# Patient Record
Sex: Female | Born: 1970 | Race: Black or African American | Hispanic: No | Smoking: Never smoker
Health system: Southern US, Community
[De-identification: ages and names within clinical notes are randomized; demographics above are authoritative.]

## PROBLEM LIST (undated history)

## (undated) HISTORY — PX: HERNIA REPAIR: SHX51

---

## 2007-06-26 ENCOUNTER — Encounter: Payer: Self-pay | Admitting: Internal Medicine

## 2007-08-07 ENCOUNTER — Ambulatory Visit: Payer: Self-pay | Admitting: Internal Medicine

## 2007-08-07 ENCOUNTER — Encounter: Admission: RE | Admit: 2007-08-07 | Discharge: 2007-08-07 | Payer: Self-pay | Admitting: Internal Medicine

## 2007-08-07 DIAGNOSIS — B20 Human immunodeficiency virus [HIV] disease: Secondary | ICD-10-CM

## 2007-08-07 DIAGNOSIS — J45909 Unspecified asthma, uncomplicated: Secondary | ICD-10-CM | POA: Insufficient documentation

## 2007-08-07 LAB — CONVERTED CEMR LAB
ALT: 10 units/L (ref 0–35)
Basophils Absolute: 0 10*3/uL (ref 0.0–0.1)
CO2: 22 meq/L (ref 19–32)
Calcium: 8.8 mg/dL (ref 8.4–10.5)
Chlamydia, Swab/Urine, PCR: NEGATIVE
Chloride: 106 meq/L (ref 96–112)
GC Probe Amp, Urine: NEGATIVE
HIV 1 RNA Quant: 78 copies/mL — ABNORMAL HIGH (ref <50)
HIV: REACTIVE
Hemoglobin, Urine: NEGATIVE
Hepatitis B Surface Ag: NEGATIVE
Hepatitis B Surface Ag: NEGATIVE
Leukocytes, UA: NEGATIVE
Lymphocytes Relative: 29 % (ref 12–46)
Neutro Abs: 3.9 10*3/uL (ref 1.7–7.7)
Neutrophils Relative %: 63 % (ref 43–77)
Nitrite: NEGATIVE
Platelets: 317 10*3/uL (ref 150–400)
RDW: 13.5 % (ref 11.5–14.0)
Sodium: 139 meq/L (ref 135–145)
Total Bilirubin: 0.6 mg/dL (ref 0.3–1.2)
Total Protein: 7.6 g/dL (ref 6.0–8.3)
Urobilinogen, UA: 0.2 (ref 0.0–1.0)
pH: 8 (ref 5.0–8.0)

## 2007-08-29 ENCOUNTER — Ambulatory Visit: Payer: Self-pay | Admitting: Internal Medicine

## 2007-09-26 ENCOUNTER — Encounter (INDEPENDENT_AMBULATORY_CARE_PROVIDER_SITE_OTHER): Payer: Self-pay | Admitting: *Deleted

## 2007-10-22 ENCOUNTER — Encounter: Payer: Self-pay | Admitting: Internal Medicine

## 2007-11-17 ENCOUNTER — Encounter (INDEPENDENT_AMBULATORY_CARE_PROVIDER_SITE_OTHER): Payer: Self-pay | Admitting: *Deleted

## 2007-11-18 ENCOUNTER — Encounter: Payer: Self-pay | Admitting: Internal Medicine

## 2007-11-18 ENCOUNTER — Encounter (INDEPENDENT_AMBULATORY_CARE_PROVIDER_SITE_OTHER): Payer: Self-pay | Admitting: *Deleted

## 2007-12-03 ENCOUNTER — Ambulatory Visit: Payer: Self-pay | Admitting: Internal Medicine

## 2007-12-03 ENCOUNTER — Encounter: Admission: RE | Admit: 2007-12-03 | Discharge: 2007-12-03 | Payer: Self-pay | Admitting: Internal Medicine

## 2007-12-03 LAB — CONVERTED CEMR LAB
ALT: 10 units/L (ref 0–35)
AST: 16 units/L (ref 0–37)
Albumin: 3.9 g/dL (ref 3.5–5.2)
Basophils Absolute: 0 10*3/uL (ref 0.0–0.1)
CO2: 25 meq/L (ref 19–32)
Calcium: 9 mg/dL (ref 8.4–10.5)
Chloride: 106 meq/L (ref 96–112)
HIV 1 RNA Quant: 113 copies/mL — ABNORMAL HIGH (ref <50)
Hemoglobin: 11.3 g/dL — ABNORMAL LOW (ref 12.0–15.0)
Lymphocytes Relative: 29 % (ref 12–46)
Monocytes Absolute: 0.6 10*3/uL (ref 0.1–1.0)
Neutro Abs: 4.3 10*3/uL (ref 1.7–7.7)
Neutrophils Relative %: 62 % (ref 43–77)
Platelets: 294 10*3/uL (ref 150–400)
Potassium: 4 meq/L (ref 3.5–5.3)
RDW: 14.4 % (ref 11.5–15.5)
Sodium: 141 meq/L (ref 135–145)
Total Protein: 7.2 g/dL (ref 6.0–8.3)

## 2007-12-26 ENCOUNTER — Ambulatory Visit: Payer: Self-pay | Admitting: Internal Medicine

## 2008-03-09 ENCOUNTER — Telehealth (INDEPENDENT_AMBULATORY_CARE_PROVIDER_SITE_OTHER): Payer: Self-pay | Admitting: *Deleted

## 2008-06-17 ENCOUNTER — Encounter: Payer: Self-pay | Admitting: Internal Medicine

## 2008-06-25 ENCOUNTER — Encounter: Payer: Self-pay | Admitting: Internal Medicine

## 2008-06-25 ENCOUNTER — Encounter: Admission: RE | Admit: 2008-06-25 | Discharge: 2008-06-25 | Payer: Self-pay | Admitting: Internal Medicine

## 2008-06-25 ENCOUNTER — Ambulatory Visit: Payer: Self-pay | Admitting: Internal Medicine

## 2008-06-25 DIAGNOSIS — R030 Elevated blood-pressure reading, without diagnosis of hypertension: Secondary | ICD-10-CM

## 2008-06-25 LAB — CONVERTED CEMR LAB
ALT: 11 units/L (ref 0–35)
AST: 16 units/L (ref 0–37)
Alkaline Phosphatase: 40 units/L (ref 39–117)
BUN: 13 mg/dL (ref 6–23)
Basophils Absolute: 0 10*3/uL (ref 0.0–0.1)
Basophils Relative: 0 % (ref 0–1)
Calcium: 8.8 mg/dL (ref 8.4–10.5)
Chloride: 105 meq/L (ref 96–112)
Creatinine, Ser: 0.72 mg/dL (ref 0.40–1.20)
Eosinophils Absolute: 0.1 10*3/uL (ref 0.0–0.7)
MCHC: 31.2 g/dL (ref 30.0–36.0)
MCV: 89 fL (ref 78.0–100.0)
Neutro Abs: 4.3 10*3/uL (ref 1.7–7.7)
Neutrophils Relative %: 65 % (ref 43–77)
Pap Smear: NORMAL
Potassium: 3.9 meq/L (ref 3.5–5.3)
RDW: 14.5 % (ref 11.5–15.5)

## 2008-07-07 ENCOUNTER — Encounter: Payer: Self-pay | Admitting: Internal Medicine

## 2008-07-14 ENCOUNTER — Ambulatory Visit: Payer: Self-pay | Admitting: Internal Medicine

## 2008-12-07 ENCOUNTER — Encounter: Payer: Self-pay | Admitting: Internal Medicine

## 2009-01-20 ENCOUNTER — Ambulatory Visit: Payer: Self-pay | Admitting: Internal Medicine

## 2009-01-20 LAB — CONVERTED CEMR LAB
AST: 16 units/L (ref 0–37)
Albumin: 4.3 g/dL (ref 3.5–5.2)
BUN: 9 mg/dL (ref 6–23)
Band Neutrophils: 0 % (ref 0–10)
CO2: 23 meq/L (ref 19–32)
Calcium: 8.9 mg/dL (ref 8.4–10.5)
Chloride: 106 meq/L (ref 96–112)
Creatinine, Ser: 0.64 mg/dL (ref 0.40–1.20)
Eosinophils Absolute: 0.1 10*3/uL (ref 0.0–0.7)
Eosinophils Relative: 2 % (ref 0–5)
Glucose, Bld: 82 mg/dL (ref 70–99)
HCT: 36.3 % (ref 36.0–46.0)
HIV 1 RNA Quant: 632 copies/mL — ABNORMAL HIGH (ref <48)
HIV-1 RNA Quant, Log: 2.8 — ABNORMAL HIGH (ref <1.68)
Lymphocytes Relative: 28 % (ref 12–46)
Lymphs Abs: 2 10*3/uL (ref 0.7–4.0)
MCHC: 32 g/dL (ref 30.0–36.0)
MCV: 89.6 fL (ref 78.0–100.0)
Monocytes Absolute: 0.4 10*3/uL (ref 0.1–1.0)
Monocytes Relative: 6 % (ref 3–12)
Potassium: 3.7 meq/L (ref 3.5–5.3)
RBC: 4.05 M/uL (ref 3.87–5.11)
WBC: 6.9 10*3/uL (ref 4.0–10.5)

## 2009-02-08 ENCOUNTER — Ambulatory Visit: Payer: Self-pay | Admitting: Internal Medicine

## 2009-02-08 DIAGNOSIS — E663 Overweight: Secondary | ICD-10-CM

## 2009-02-08 DIAGNOSIS — Z6841 Body Mass Index (BMI) 40.0 and over, adult: Secondary | ICD-10-CM | POA: Insufficient documentation

## 2009-02-10 ENCOUNTER — Encounter (INDEPENDENT_AMBULATORY_CARE_PROVIDER_SITE_OTHER): Payer: Self-pay | Admitting: *Deleted

## 2009-06-24 ENCOUNTER — Ambulatory Visit: Payer: Self-pay | Admitting: Internal Medicine

## 2009-06-24 ENCOUNTER — Encounter: Payer: Self-pay | Admitting: Internal Medicine

## 2009-06-24 LAB — CONVERTED CEMR LAB
AST: 18 units/L (ref 0–37)
Alkaline Phosphatase: 45 units/L (ref 39–117)
Basophils Absolute: 0 10*3/uL (ref 0.0–0.1)
Basophils Relative: 0 % (ref 0–1)
Eosinophils Absolute: 0.1 10*3/uL (ref 0.0–0.7)
Glucose, Bld: 89 mg/dL (ref 70–99)
HIV 1 RNA Quant: 1210 copies/mL — ABNORMAL HIGH (ref <48)
MCHC: 30.7 g/dL (ref 30.0–36.0)
MCV: 90.9 fL (ref >=78.0)
Monocytes Absolute: 0.4 10*3/uL (ref 0.1–1.0)
Monocytes Relative: 7 % (ref 3–12)
Neutrophils Relative %: 67 % (ref 43–77)
Potassium: 3.6 meq/L (ref 3.5–5.3)
RBC: 3.97 M/uL (ref 3.87–5.11)
RDW: 14.2 % (ref 11.5–15.5)
Sodium: 140 meq/L (ref 135–145)
Total Bilirubin: 0.5 mg/dL (ref 0.3–1.2)
Total CHOL/HDL Ratio: 3.3
Total Protein: 7.1 g/dL (ref 6.0–8.3)
VLDL: 23 mg/dL (ref 0–40)

## 2009-07-01 ENCOUNTER — Encounter: Payer: Self-pay | Admitting: Internal Medicine

## 2010-01-09 ENCOUNTER — Ambulatory Visit: Payer: Self-pay | Admitting: Internal Medicine

## 2010-01-09 LAB — CONVERTED CEMR LAB
ALT: 12 units/L (ref 0–35)
AST: 17 units/L (ref 0–37)
Albumin: 4.2 g/dL (ref 3.5–5.2)
BUN: 14 mg/dL (ref 6–23)
CO2: 23 meq/L (ref 19–32)
Calcium: 8.8 mg/dL (ref 8.4–10.5)
Chloride: 104 meq/L (ref 96–112)
Creatinine, Ser: 0.7 mg/dL (ref 0.40–1.20)
Eosinophils Absolute: 0.1 10*3/uL (ref 0.0–0.7)
Eosinophils Relative: 1 % (ref 0–5)
HCT: 35.6 % — ABNORMAL LOW (ref 36.0–46.0)
HIV 1 RNA Quant: 834 copies/mL — ABNORMAL HIGH (ref <48)
Hemoglobin: 11.3 g/dL — ABNORMAL LOW (ref 12.0–15.0)
Lymphocytes Relative: 28 % (ref 12–46)
Lymphs Abs: 2.5 10*3/uL (ref 0.7–4.0)
MCV: 89 fL (ref >=78.0)
Monocytes Relative: 6 % (ref 3–12)
Platelets: 364 10*3/uL (ref 150–400)
Potassium: 4 meq/L (ref 3.5–5.3)
RBC: 4 M/uL (ref 3.87–5.11)
WBC: 9 10*3/uL (ref 4.0–10.5)

## 2010-03-17 ENCOUNTER — Ambulatory Visit: Payer: Self-pay | Admitting: Internal Medicine

## 2010-05-31 ENCOUNTER — Encounter (INDEPENDENT_AMBULATORY_CARE_PROVIDER_SITE_OTHER): Payer: Self-pay | Admitting: *Deleted

## 2010-08-16 ENCOUNTER — Encounter: Payer: Self-pay | Admitting: Internal Medicine

## 2010-08-21 ENCOUNTER — Ambulatory Visit: Payer: Self-pay | Admitting: Internal Medicine

## 2010-08-21 LAB — CONVERTED CEMR LAB
ALT: 11 units/L (ref 0–35)
AST: 17 units/L (ref 0–37)
Albumin: 4.3 g/dL (ref 3.5–5.2)
Alkaline Phosphatase: 43 units/L (ref 39–117)
Basophils Absolute: 0 10*3/uL (ref 0.0–0.1)
Basophils Relative: 0 % (ref 0–1)
Calcium: 9.2 mg/dL (ref 8.4–10.5)
Chloride: 108 meq/L (ref 96–112)
Eosinophils Absolute: 0.1 10*3/uL (ref 0.0–0.7)
MCHC: 30.9 g/dL (ref 30.0–36.0)
MCV: 90.7 fL (ref 78.0–100.0)
Neutro Abs: 4.2 10*3/uL (ref 1.7–7.7)
Neutrophils Relative %: 59 % (ref 43–77)
Platelets: 315 10*3/uL (ref 150–400)
Potassium: 4.6 meq/L (ref 3.5–5.3)
RBC: 4.21 M/uL (ref 3.87–5.11)
Sodium: 144 meq/L (ref 135–145)
Total Protein: 7.9 g/dL (ref 6.0–8.3)

## 2010-09-07 ENCOUNTER — Ambulatory Visit: Payer: Self-pay | Admitting: Internal Medicine

## 2010-12-19 NOTE — Assessment & Plan Note (Signed)
Summary: F/U [MKJ]   CC:  follow-up visit, lab results, and due for pap.  History of Present Illness: Pt here for lab results. She has been feeling well except some allergy symptoms.  Preventive Screening-Counseling & Management  Alcohol-Tobacco     Alcohol drinks/day: 0     Smoking Status: never     Passive Smoke Exposure: no  Caffeine-Diet-Exercise     Caffeine use/day: 3     Does Patient Exercise: no     Type of exercise: walking     Times/week: <3  Hep-HIV-STD-Contraception     HIV Risk: risk noted  Safety-Violence-Falls     Seat Belt Use: yes      Sexual History:  currently monogamous.        Drug Use:  never.    Comments: pt. declined condoms   Updated Prior Medication List: PROVENTIL HFA 108 (90 BASE) MCG/ACT AERS (ALBUTEROL SULFATE) one puff every 6 hours as needed ATENOLOL 50 MG TABS (ATENOLOL) Take 1 tablet by mouth once a day  Current Allergies (reviewed today): No known allergies  Past History:  Past Medical History: Last updated: 08/29/2007 Asthma  Review of Systems  The patient denies anorexia, fever, and weight loss.    Vital Signs:  Patient profile:   40 year old female Menstrual status:  regular Height:      62 inches (157.48 cm) Weight:      276.0 pounds (125.45 kg) BMI:     50.66 Temp:     98.8 degrees F (37.11 degrees C) oral Pulse rate:   81 / minute BP sitting:   138 / 88  (right arm)  Vitals Entered By: Wendall Mola CMA Duncan Dull) (September 07, 2010 2:44 PM) CC: follow-up visit, lab results, due for pap Is Patient Diabetic? No Pain Assessment Patient in pain? no      Nutritional Status BMI of > 30 = obese Nutritional Status Detail appetite "good"  Have you ever been in a relationship where you felt threatened, hurt or afraid?No   Does patient need assistance? Functional Status Self care Ambulation Normal   Physical Exam  General:  alert, well-developed, well-nourished, and well-hydrated.   Head:   normocephalic and atraumatic.   Mouth:  pharynx pink and moist.   Lungs:  normal breath sounds and no wheezes.     Impression & Recommendations:  Problem # 1:  HIV INFECTION (ICD-042)  pt currently asymptomatic and not on therapy. Wil lreturn in 6 months for repeat labs.  Influenza vaccine given. Schedule for PAP. Diagnostics Reviewed:  HIV: HIV positive - not AIDS (02/08/2009)   HIV-Western blot: Positive (08/07/2007)   CD4: 750 (08/22/2010)   WBC: 7.1 (08/21/2010)   PMN (bands): 0 (01/20/2009)   Hgb: 11.8 (08/21/2010)   HCT: 38.2 (08/21/2010)   Platelets: 315 (08/21/2010) HIV-1 RNA: 891 (08/21/2010)   HBSAg: NEG (08/07/2007)  Orders: Est. Patient Level III (99213)Future Orders: T-CD4SP ([facility]) (CD4SP) ... 03/06/2011 T-HIV Viral Load [id number]) ... 03/06/2011 T-Comprehensive Metabolic Panel [id number]) ... 03/06/2011 T-CBC w/Diff ([id number]) ... 03/06/2011 T-RPR (Syphilis) [phone]) ... 03/06/2011  Problem # 2:  ASTHMA NOS W/O STATUS ASTHMATICUS (ICD-493.90) refill inhaler. Her updated medication list for this problem includes:    Proventil Hfa 108 (90 Base) Mcg/act Aers (Albuterol sulfate) ..... One puff every 6 hours as needed  Patient Instructions: 1)  Please schedule a follow-up appointment in 6 months, 2 weeks after labs. 2)  Schedule for a PAP in PAP clinic  Prescriptions: PROVENTIL HFA 108 (  90 BASE) MCG/ACT AERS (ALBUTEROL SULFATE) one puff every 6 hours as needed  #24 Gram x 5   Entered and Authorized by:   Yisroel Ramming MD   Signed by:   Yisroel Ramming MD on 09/07/2010   Method used:   Print then Give to Patient   RxID:   [id number]

## 2010-12-19 NOTE — Assessment & Plan Note (Signed)
Summary: f/u [mkj]   CC:  follow-up visit and lab results.  History of Present Illness: Pt here for f/u.  She has been feeling well. She is concerned about the health of her husband who is not in care for his HIV.  She will try to encourage him to call foa an appt.  Preventive Screening-Counseling & Management  Alcohol-Tobacco     Alcohol drinks/day: 0     Smoking Status: never     Passive Smoke Exposure: no  Caffeine-Diet-Exercise     Caffeine use/day: 3     Does Patient Exercise: yes     Type of exercise: walking     Times/week: <3  Hep-HIV-STD-Contraception     HIV Risk: no risk noted  Safety-Violence-Falls     Seat Belt Use: yes      Sexual History:  currently monogamous.        Drug Use:  never.     Updated Prior Medication List: PROVENTIL HFA 108 (90 BASE) MCG/ACT AERS (ALBUTEROL SULFATE) one puff every 6 hours as needed ATENOLOL 50 MG TABS (ATENOLOL) Take 1 tablet by mouth once a day  Current Allergies (reviewed today): No known allergies  Review of Systems  The patient denies anorexia, fever, and weight loss.    Vital Signs:  Patient profile:   40 year old female Menstrual status:  regular Height:      62 inches (157.48 cm) Weight:      276.4 pounds (125.64 kg) BMI:     50.74 Temp:     98.8 degrees F (37.11 degrees C) oral Pulse rate:   92 / minute BP sitting:   127 / 82  (left arm)  Vitals Entered By: Wendall Mola CMA Duncan Dull) (March 17, 2010 3:37 PM) CC: follow-up visit, lab results Is Patient Diabetic? No Pain Assessment Patient in pain? no      Nutritional Status BMI of > 30 = obese Nutritional Status Detail appetite "good"  Does patient need assistance? Functional Status Self care Ambulation Normal   Physical Exam  General:  alert, well-hydrated, and overweight-appearing.   Head:  normocephalic and atraumatic.   Mouth:  pharynx pink and moist.   Lungs:  normal breath sounds.      Impression & Recommendations:  Problem  # 1:  HIV INFECTION (ICD-042) Pt currently asymptomatic and not on therapy. She will return in 6 months for repeat labs. Diagnostics Reviewed:  HIV: HIV positive - not AIDS (02/08/2009)   HIV-Western blot: Positive (08/07/2007)   CD4: 900 (01/10/2010)   WBC: 9.0 (01/09/2010)   PMN (bands): 0 (01/20/2009)   Hgb: 11.3 (01/09/2010)   HCT: 35.6 (01/09/2010)   Platelets: 364 (01/09/2010) HIV-1 RNA: 834 (01/09/2010)   HBSAg: NEG (08/07/2007)  Orders: Est. Patient Level III (99213)Future Orders: T-CD4SP ([facility]) (CD4SP) ... 09/13/2010 T-HIV Viral Load [id number]) ... 09/13/2010 T-Comprehensive Metabolic Panel [id number]) ... 09/13/2010 T-CBC w/Diff ([id number]) ... 09/13/2010  Patient Instructions: 1)  Please schedule a follow-up appointment in 6 months, 2 weeks after labs.

## 2010-12-19 NOTE — Miscellaneous (Signed)
Summary: Lab orders  Clinical Lists Changes  Orders: Added new Test order of T-CBC w/Diff [phone] [phone]) - Signed Added new Test order of T-CD4SP [facility]) (CD4SP) - Signed Added new Test order of T-Comprehensive Metabolic Panel [id number]) - Signed Added new Test order of T-HIV Viral Load [id number]) - Signed     Process Orders Check Orders Results:     [facility]: ABN not required for this insurance Order queued for requisitioning for [facility]: January 09, 2010 4:25 PM  Tests Sent for requisitioning (January 09, 2010 4:25 PM):     01/09/2010: [facility] -- T-CBC w/Diff [[id number]] (signed)     01/09/2010: [facility] -- T-Comprehensive Metabolic Panel [[id number]] (signed)     01/09/2010: [facility] -- T-HIV Viral Load [phone] (signed)

## 2010-12-19 NOTE — Miscellaneous (Signed)
Summary: Orders Update - labs  Clinical Lists Changes  Problems: Added new problem of ENCOUNTER FOR LONG-TERM USE OF OTHER MEDICATIONS (ICD-V58.69) Orders: Added new Test order of T-RPR (Syphilis) [id number]) - Signed Added new Test order of T-Lipid Profile [id number]) - Signed     Process Orders Check Orders Results:     [facility]: ABN not required for this insurance Order queued for requisitioning for [facility]: August 16, 2010 10:57 AM  Tests Sent for requisitioning (August 16, 2010 10:57 AM):     08/21/2010: [facility] -- T-RPR (Syphilis) [phone] (signed)     08/21/2010: [facility] -- T-Lipid Profile [phone] (signed)

## 2010-12-19 NOTE — Miscellaneous (Signed)
Summary: clinical update/ryan white  Clinical Lists Changes  Observations: Added new observation of AIDSDAP: Yes 2011 (05/31/2010 9:50) Added new observation of PCTFPL: 77.87  (05/31/2010 9:50) Added new observation of HOUSEINCOME: 16109  (05/31/2010 9:50) Added new observation of #CHILD<18 IN: Yes  (05/31/2010 9:50) Added new observation of FAMILYSIZE: 6  (05/31/2010 9:50) Added new observation of FINASSESSDT: 05/29/2010  (05/31/2010 9:50) Added new observation of MARITAL STAT: Widowed  (05/31/2010 9:50)

## 2011-01-12 ENCOUNTER — Ambulatory Visit: Payer: Self-pay

## 2011-01-26 ENCOUNTER — Ambulatory Visit: Payer: Self-pay

## 2011-02-02 ENCOUNTER — Ambulatory Visit (INDEPENDENT_AMBULATORY_CARE_PROVIDER_SITE_OTHER): Payer: Self-pay

## 2011-02-02 ENCOUNTER — Other Ambulatory Visit: Payer: Self-pay | Admitting: Internal Medicine

## 2011-02-02 ENCOUNTER — Encounter: Payer: Self-pay | Admitting: Internal Medicine

## 2011-02-02 DIAGNOSIS — Z124 Encounter for screening for malignant neoplasm of cervix: Secondary | ICD-10-CM

## 2011-02-07 LAB — T-HELPER CELL (CD4) - (RCID CLINIC ONLY): CD4 T Cell Abs: 900 uL (ref 400–2700)

## 2011-02-09 ENCOUNTER — Encounter: Payer: Self-pay | Admitting: *Deleted

## 2011-02-15 NOTE — Letter (Signed)
Summary: Results Follow-up Letter  [facility]  [address]   [place], [place] [address]   Phone: [phone]  Fax: [phone]          February 12, 2011  [address] [place], [place]  [address]  [place]  Dear Ms. Khatoon,   The following are the results of your recent test(s):  Test     Result     Pap Smear    Normal_XXX___  Not Normal_____  Comments:  Everything was normal.  I will see you in one year for your next PAP smear.  Thank you for coming to the Center for your care.  Sincerely,    Jennet Maduro [facility]

## 2011-02-15 NOTE — Assessment & Plan Note (Signed)
Summary: pap smear   Vitals Entered By: Jennet Maduro RN (February 09, 2011 3:24 PM) CC: PAP smear visit.  Pt. given condoms.  Pt. given educational materials re:  HIV and women, BSE, nutrition, diet, exercise, mammagrams, and self-esteem.   CC:  PAP smear visit.  Pt. given condoms.  Pt. given educational materials re:  HIV and women, BSE, nutrition, diet, exercise, mammagrams, and and self-esteem..  Allergies: No Known Drug Allergies   Other Orders: T-PAP [facility]) [phone]) T-Lipid Profile [id number])  Patient Instructions: 1)  Please schedule MD appointment for pt.  2)  Please schedule a follow-up appointment in 1 month. 3)  Be sure to return for lab work one (1) week before your next appointment as scheduled.   Orders Added: 1)  T-PAP [facility]) [[id number]] 2)  T-Lipid Profile [id number]    Process Orders Check Orders Results:     [facility]: ABN not required for this insurance Tests Sent for requisitioning (February 09, 2011 3:24 PM):     02/02/2011: [facility] -- T-Lipid Profile [phone] (signed)             Prevention For Positives: 02/02/2011   Safe sex practices discussed with patient. Condoms offered.

## 2011-02-24 LAB — T-HELPER CELL (CD4) - (RCID CLINIC ONLY): CD4 % Helper T Cell: 32 % — ABNORMAL LOW (ref 33–55)

## 2011-02-26 ENCOUNTER — Other Ambulatory Visit: Payer: Self-pay

## 2011-03-02 ENCOUNTER — Other Ambulatory Visit (INDEPENDENT_AMBULATORY_CARE_PROVIDER_SITE_OTHER): Payer: BC Managed Care – PPO

## 2011-03-02 ENCOUNTER — Other Ambulatory Visit: Payer: Self-pay | Admitting: Infectious Diseases

## 2011-03-02 DIAGNOSIS — B20 Human immunodeficiency virus [HIV] disease: Secondary | ICD-10-CM

## 2011-03-02 LAB — CBC WITH DIFFERENTIAL/PLATELET
Basophils Absolute: 0 10*3/uL (ref 0.0–0.1)
Basophils Relative: 0 % (ref 0–1)
Eosinophils Relative: 2 % (ref 0–5)
HCT: 37.6 % (ref 36.0–46.0)
Hemoglobin: 12 g/dL (ref 12.0–15.0)
MCH: 29.3 pg (ref 26.0–34.0)
MCHC: 31.9 g/dL (ref 30.0–36.0)
MCV: 91.7 fL (ref 78.0–100.0)
Monocytes Absolute: 0.4 10*3/uL (ref 0.1–1.0)
Monocytes Relative: 6 % (ref 3–12)
Neutro Abs: 4.6 10*3/uL (ref 1.7–7.7)
RDW: 14.1 % (ref 11.5–15.5)

## 2011-03-02 LAB — LIPID PANEL
Cholesterol: 162 mg/dL (ref 0–200)
LDL Cholesterol: 99 mg/dL (ref 0–99)
Total CHOL/HDL Ratio: 3.6 Ratio
VLDL: 18 mg/dL (ref 0–40)

## 2011-03-02 LAB — T-HELPER CELL (CD4) - (RCID CLINIC ONLY)
CD4 % Helper T Cell: 33 % (ref 33–55)
CD4 T Cell Abs: 580 uL (ref 400–2700)

## 2011-03-03 LAB — COMPLETE METABOLIC PANEL WITH GFR
ALT: 14 U/L (ref 0–35)
AST: 22 U/L (ref 0–37)
Albumin: 4.4 g/dL (ref 3.5–5.2)
Alkaline Phosphatase: 46 U/L (ref 39–117)
Potassium: 4.6 mEq/L (ref 3.5–5.3)
Sodium: 141 mEq/L (ref 135–145)
Total Bilirubin: 0.3 mg/dL (ref 0.3–1.2)
Total Protein: 7.6 g/dL (ref 6.0–8.3)

## 2011-03-03 LAB — HIV-1 RNA QUANT-NO REFLEX-BLD: HIV-1 RNA Quant, Log: 2.96 {Log} — ABNORMAL HIGH (ref <1.30)

## 2011-03-12 ENCOUNTER — Ambulatory Visit: Payer: Self-pay | Admitting: Internal Medicine

## 2011-04-03 ENCOUNTER — Encounter: Payer: Self-pay | Admitting: Infectious Disease

## 2011-04-03 ENCOUNTER — Ambulatory Visit (INDEPENDENT_AMBULATORY_CARE_PROVIDER_SITE_OTHER): Payer: Self-pay | Admitting: Infectious Disease

## 2011-04-03 VITALS — BP 121/89 | HR 80 | Temp 98.7°F | Ht 64.0 in | Wt 256.0 lb

## 2011-04-03 DIAGNOSIS — J45909 Unspecified asthma, uncomplicated: Secondary | ICD-10-CM

## 2011-04-03 DIAGNOSIS — E663 Overweight: Secondary | ICD-10-CM

## 2011-04-03 DIAGNOSIS — B20 Human immunodeficiency virus [HIV] disease: Secondary | ICD-10-CM

## 2011-04-03 DIAGNOSIS — R030 Elevated blood-pressure reading, without diagnosis of hypertension: Secondary | ICD-10-CM

## 2011-04-03 NOTE — Assessment & Plan Note (Signed)
She has been successfully losing weight through diet and exercise.

## 2011-04-03 NOTE — Assessment & Plan Note (Signed)
She is normotensive without using her atenolol.

## 2011-04-03 NOTE — Assessment & Plan Note (Signed)
His relates more to seasonal allergies and she infrequently uses albuterol inhaler.

## 2011-04-03 NOTE — Progress Notes (Signed)
  Subjective:    Patient ID: Regina Mcdonald, female    DOB: 1971-03-30, 40 y.o.   MRN: [id number]  HPI 14 -year-old [place] American female with HIV who appears to be a long-term nonprogressor. She has persistent low-level viremia which ranges as low as the 70s approximately 4 years ago to up to 1200 Generally her viral loads have been between 500 800. Her CD4 count similarly have been healthy ranging as high as 900 as low as 500. We spent greater than 45 minutes today including greater than 50% of time spent in counseling the patient and according care including extensive discussion of the indications for antiretroviral therapy various antiretroviral regimens. I also introduced her to the strategic timing for antiretroviral therapy or start trial. Deirdre Evener met with the pt from [facility] to consider enrolling the patient. With regards to her contraception the patient is using intrauterine device at this point in time. He claims is not 6 active at this point in time. She has occasional flares of allergic rhinitis and cough for which she's taken albuterol. She's currently not taking medications for prior elevated blood pressure. Review of Systems  Constitutional: Negative for chills, diaphoresis, activity change and appetite change.  HENT: Negative for facial swelling and neck stiffness.   Eyes: Negative for photophobia and visual disturbance.  Respiratory: Negative for apnea, cough, choking, chest tightness, shortness of breath, wheezing and stridor.   Cardiovascular: Negative for chest pain, palpitations and leg swelling.  Gastrointestinal: Negative for abdominal pain, constipation, blood in stool, abdominal distention and anal bleeding.  Genitourinary: Negative for dysuria and difficulty urinating.  Musculoskeletal: Negative for myalgias, back pain, joint swelling, arthralgias and gait problem.  Skin: Negative for color change and pallor.  Neurological: Negative for dizziness, tremors, facial  asymmetry, weakness and headaches.  Hematological: Negative for adenopathy. Does not bruise/bleed easily.  Psychiatric/Behavioral: Negative for behavioral problems, confusion and agitation.       Objective:   Physical Exam  Constitutional: She is oriented to person, place, and time. She appears well-developed and well-nourished. No distress.  HENT:  Head: Normocephalic and atraumatic.  Mouth/Throat: No oropharyngeal exudate.  Eyes: EOM are normal. Pupils are equal, round, and reactive to light. No scleral icterus.  Neck: No JVD present.  Cardiovascular: Normal rate and regular rhythm.  Exam reveals no gallop and no friction rub.   No murmur heard. Pulmonary/Chest: Effort normal and breath sounds normal. No respiratory distress. She has no wheezes. She has no rales. She exhibits no tenderness.  Abdominal: She exhibits no distension and no mass. There is no tenderness. There is no rebound and no guarding.  Musculoskeletal: She exhibits no edema.  Lymphadenopathy:    She has no cervical adenopathy.  Neurological: She is alert and oriented to person, place, and time.  Skin: Skin is warm and dry. No rash noted. She is not diaphoretic. No erythema. No pallor.          Assessment & Plan:

## 2011-04-03 NOTE — Assessment & Plan Note (Signed)
Apparent long-term nonprogressor. Nonetheless she does have detectable viremia and I expect her CD4 count will gradually declined. I feel she would be an ideal candidate for the start trial.

## 2011-04-10 ENCOUNTER — Ambulatory Visit (INDEPENDENT_AMBULATORY_CARE_PROVIDER_SITE_OTHER): Payer: Self-pay | Admitting: *Deleted

## 2011-04-10 ENCOUNTER — Encounter: Payer: Self-pay | Admitting: *Deleted

## 2011-04-10 VITALS — BP 130/74 | HR 76 | Temp 98.7°F | Resp 18 | Ht 63.0 in | Wt 260.2 lb

## 2011-04-10 DIAGNOSIS — B20 Human immunodeficiency virus [HIV] disease: Secondary | ICD-10-CM

## 2011-04-10 LAB — BASIC METABOLIC PANEL
BUN: 10 mg/dL (ref 6–23)
CO2: 26 mEq/L (ref 19–32)
Chloride: 104 mEq/L (ref 96–112)
Creat: 0.62 mg/dL (ref 0.40–1.20)
Potassium: 3.9 mEq/L (ref 3.5–5.3)

## 2011-04-10 LAB — HEPATIC FUNCTION PANEL
Albumin: 4.2 g/dL (ref 3.5–5.2)
Alkaline Phosphatase: 48 U/L (ref 39–117)
Total Bilirubin: 0.4 mg/dL (ref 0.3–1.2)

## 2011-04-10 LAB — POCT URINALYSIS DIPSTICK
Glucose, UA: NEGATIVE
Nitrite, UA: NEGATIVE
Protein, UA: NEGATIVE
Urobilinogen, UA: 0.2
pH, UA: 6.5

## 2011-04-10 LAB — LIPID PANEL
Cholesterol: 133 mg/dL (ref 0–200)
Triglycerides: 86 mg/dL (ref <150)
VLDL: 17 mg/dL (ref 0–40)

## 2011-04-10 NOTE — Progress Notes (Signed)
Patient was here today to screen for the [facility] study. She had read the consent prior to the visit and had already discussed the study with Dr. Daiva Eves. Informed consent was obtained and all questions answered. She understands that she may get randomized to either deferred treatment or start treatment right away. She denies any current problems or complaints. She takes a multivitamin everyday and occasionally uses tylenol for discomfort. She acquired HIV through her husband some years ago. She will return in 2 weeks for the second screening visit.

## 2011-04-11 LAB — HEPATITIS B SURFACE ANTIGEN: Hepatitis B Surface Ag: NEGATIVE

## 2011-04-11 LAB — HEPATITIS C ANTIBODY: HCV Ab: NEGATIVE

## 2011-04-12 LAB — HEPATITIS B CORE ANTIBODY, TOTAL: Hep B Core Total Ab: POSITIVE — AB

## 2011-04-20 ENCOUNTER — Other Ambulatory Visit: Payer: Self-pay | Admitting: Family Medicine

## 2011-04-20 ENCOUNTER — Ambulatory Visit
Admission: RE | Admit: 2011-04-20 | Discharge: 2011-04-20 | Disposition: A | Discharge status: Home / Self Care | Payer: BC Managed Care – PPO | Source: Ambulatory Visit | Attending: Family Medicine | Admitting: Family Medicine

## 2011-04-20 DIAGNOSIS — R112 Nausea with vomiting, unspecified: Secondary | ICD-10-CM

## 2011-04-20 DIAGNOSIS — K439 Ventral hernia without obstruction or gangrene: Secondary | ICD-10-CM

## 2011-04-20 MED ORDER — IOHEXOL 300 MG/ML  SOLN
100.0000 mL | Freq: Once | INTRAMUSCULAR | Status: AC | PRN
  Administered 2011-04-20: 100 mL via INTRAVENOUS

## 2011-04-24 ENCOUNTER — Ambulatory Visit (INDEPENDENT_AMBULATORY_CARE_PROVIDER_SITE_OTHER): Payer: BC Managed Care – PPO | Admitting: *Deleted

## 2011-04-24 VITALS — BP 139/87 | HR 84 | Temp 98.7°F | Resp 16 | Wt 260.5 lb

## 2011-04-24 DIAGNOSIS — B20 Human immunodeficiency virus [HIV] disease: Secondary | ICD-10-CM

## 2011-04-24 LAB — POCT URINE PREGNANCY: Preg Test, Ur: NEGATIVE

## 2011-04-24 NOTE — Progress Notes (Signed)
04/24/2011 @ 0800: Pt here for last screening visit for START study. Pt denies any new findings. Vital signs are stable. EKG was performed as well as urine pregnancy test and CD4. Pt informed that we will call her with next appointment if criteria for study is met and she still wants to enter into [facility] study. -- Tacey Heap RN

## 2011-05-02 ENCOUNTER — Encounter: Payer: Self-pay | Admitting: Internal Medicine

## 2011-05-02 ENCOUNTER — Encounter: Payer: Self-pay | Admitting: *Deleted

## 2011-05-02 LAB — CD4/CD8 (T-HELPER/T-SUPPRESSOR CELL)
CD8 % Suppressor T Cell: 36
CD8 % Suppressor T Cell: 38.5
CD8: 576

## 2011-05-02 NOTE — Progress Notes (Signed)
Patient was randomized to meds for the START study yesterday. She will come in and see Dr. Daiva Eves next week on Monday at 8:45am to discuss medication options.

## 2011-05-03 ENCOUNTER — Encounter (INDEPENDENT_AMBULATORY_CARE_PROVIDER_SITE_OTHER): Payer: Self-pay | Admitting: General Surgery

## 2011-05-07 ENCOUNTER — Ambulatory Visit (INDEPENDENT_AMBULATORY_CARE_PROVIDER_SITE_OTHER): Payer: BC Managed Care – PPO | Admitting: Infectious Disease

## 2011-05-07 DIAGNOSIS — B20 Human immunodeficiency virus [HIV] disease: Secondary | ICD-10-CM

## 2011-05-07 DIAGNOSIS — R030 Elevated blood-pressure reading, without diagnosis of hypertension: Secondary | ICD-10-CM

## 2011-05-07 DIAGNOSIS — E663 Overweight: Secondary | ICD-10-CM

## 2011-05-07 MED ORDER — EFAVIRENZ-EMTRICITAB-TENOFOVIR 600-200-300 MG PO TABS: 1.0000 | ORAL_TABLET | Freq: Every day | ORAL | Status: DC

## 2011-05-07 NOTE — Progress Notes (Signed)
  Subjective:    Patient ID: Regina Mcdonald, female    DOB: 21-Oct-1971, 40 y.o.   MRN: [id number]  HPI 8 -year-old [place] American female with HIV who appears to be a long-term nonprogressor. She has persistent low-level viremia which ranges as low as the 70s approximately 4 years ago to up to 1200 Generally her viral loads have been between 500 800. Her CD4 count similarly have been healthy ranging as high as 900 as low as 500.Patient is using intrauterine device at this point in time.  She enrolled in START trial and was randomized to begin therapy. We discussed treatment options and she desires to start Atripla a prescription of which I wrote today    Review of Systems  Constitutional: Negative for chills, diaphoresis, activity change and appetite change.  HENT: Negative for facial swelling and neck pain.   Eyes: Negative for photophobia and visual disturbance.  Respiratory: Negative for apnea, choking and chest tightness.   Cardiovascular: Negative for chest pain, palpitations and leg swelling.  Genitourinary: Negative for dysuria and difficulty urinating.  Musculoskeletal: Negative for back pain, joint swelling, arthralgias and gait problem.  Neurological: Negative for light-headedness and headaches.  Hematological: Negative for adenopathy. Does not bruise/bleed easily.  Psychiatric/Behavioral: Negative for behavioral problems and agitation.       Objective:   Physical Exam  Constitutional: She is oriented to person, place, and time. She appears well-developed and well-nourished. No distress.  HENT:  Head: Normocephalic and atraumatic.  Nose: Nose normal.  Mouth/Throat: No oropharyngeal exudate.  Eyes: EOM are normal. Pupils are equal, round, and reactive to light. No scleral icterus.  Neck: No JVD present.  Cardiovascular: Normal rate, regular rhythm and normal heart sounds.  Exam reveals no gallop and no friction rub.   No murmur heard. Pulmonary/Chest: Effort normal and  breath sounds normal. No respiratory distress. She has no wheezes. She has no rales.  Abdominal: She exhibits no distension. There is no tenderness.  Musculoskeletal: She exhibits edema. She exhibits no tenderness.  Lymphadenopathy:    She has no cervical adenopathy.  Neurological: She is alert and oriented to person, place, and time. Coordination normal.  Skin: No rash noted. She is not diaphoretic. No erythema. No pallor.  Psychiatric: She has a normal mood and affect. Her behavior is normal. Judgment and thought content normal.          Assessment & Plan:  No problem-specific assessment & plan notes found for this encounter.

## 2011-05-07 NOTE — Assessment & Plan Note (Signed)
Will reassess at next visit

## 2011-05-07 NOTE — Assessment & Plan Note (Signed)
Will start atripla via [facility]

## 2011-05-07 NOTE — Assessment & Plan Note (Signed)
counselled to lose weight

## 2011-05-29 ENCOUNTER — Ambulatory Visit (INDEPENDENT_AMBULATORY_CARE_PROVIDER_SITE_OTHER): Payer: BC Managed Care – PPO | Admitting: *Deleted

## 2011-05-29 VITALS — BP 133/78 | HR 89 | Temp 98.5°F | Resp 18 | Wt 259.0 lb

## 2011-05-29 DIAGNOSIS — B20 Human immunodeficiency virus [HIV] disease: Secondary | ICD-10-CM

## 2011-05-29 LAB — POCT URINALYSIS DIPSTICK
Glucose, UA: NEGATIVE
Leukocytes, UA: NEGATIVE
Nitrite, UA: NEGATIVE
Spec Grav, UA: >=1.03
Urobilinogen, UA: 0.2

## 2011-05-29 LAB — BASIC METABOLIC PANEL
Chloride: 105 mEq/L (ref 96–112)
Creat: 0.68 mg/dL (ref 0.50–1.10)

## 2011-05-29 NOTE — Progress Notes (Signed)
Patient is here for her 1 month START study visit. She denies any current problems. She said she didn't notice any side effects from the [place] when she started but that app. 3 days later her sex drive increased dramatically for about 1 week. She will return in 3 months for her next study appt.

## 2011-05-31 LAB — HIV-1 RNA QUANT-NO REFLEX-BLD
HIV 1 RNA Quant: <20 copies/mL (ref <20)
HIV-1 RNA Quant, Log: <1.3 {Log} (ref <1.30)

## 2011-06-05 ENCOUNTER — Ambulatory Visit (HOSPITAL_COMMUNITY): Admission: RE | Admit: 2011-06-05 | Payer: BC Managed Care – PPO | Source: Ambulatory Visit | Admitting: General Surgery

## 2011-06-29 ENCOUNTER — Encounter: Payer: Self-pay | Admitting: Internal Medicine

## 2011-06-29 LAB — CD4/CD8 (T-HELPER/T-SUPPRESSOR CELL): CD8: 449

## 2011-08-17 LAB — T-HELPER CELL (CD4) - (RCID CLINIC ONLY)
CD4 % Helper T Cell: 39
CD4 T Cell Abs: 680

## 2011-08-27 ENCOUNTER — Ambulatory Visit (INDEPENDENT_AMBULATORY_CARE_PROVIDER_SITE_OTHER): Payer: BC Managed Care – PPO | Admitting: *Deleted

## 2011-08-27 VITALS — BP 128/78 | HR 79 | Temp 98.5°F | Resp 16 | Wt 272.2 lb

## 2011-08-27 DIAGNOSIS — Z21 Asymptomatic human immunodeficiency virus [HIV] infection status: Secondary | ICD-10-CM

## 2011-08-27 DIAGNOSIS — Z0000 Encounter for general adult medical examination without abnormal findings: Secondary | ICD-10-CM

## 2011-08-27 DIAGNOSIS — Z23 Encounter for immunization: Secondary | ICD-10-CM

## 2011-08-27 DIAGNOSIS — B20 Human immunodeficiency virus [HIV] disease: Secondary | ICD-10-CM

## 2011-08-27 LAB — POCT URINALYSIS DIPSTICK
Bilirubin, UA: NEGATIVE
Glucose, UA: NEGATIVE
Ketones, UA: NEGATIVE
Leukocytes, UA: NEGATIVE
Nitrite, UA: NEGATIVE
Protein, UA: NEGATIVE
Spec Grav, UA: 1.015
Urobilinogen, UA: 0.2
pH, UA: 7.5

## 2011-08-27 NOTE — Progress Notes (Signed)
Patient here for 4 month [facility] study visit. She denies any complaints and hasn't had any side effects from the [place]. States she is taking it everyday. She will return in February for the next study visit.

## 2011-08-28 LAB — BASIC METABOLIC PANEL
BUN: 9 mg/dL (ref 6–23)
CO2: 24 mEq/L (ref 19–32)
Calcium: 9 mg/dL (ref 8.4–10.5)
Creat: 0.67 mg/dL (ref 0.50–1.10)
Glucose, Bld: 109 mg/dL — ABNORMAL HIGH (ref 70–99)
Sodium: 142 mEq/L (ref 135–145)

## 2011-08-30 LAB — T-HELPER CELL (CD4) - (RCID CLINIC ONLY): CD4 T Cell Abs: 710

## 2011-09-13 ENCOUNTER — Encounter: Payer: Self-pay | Admitting: Infectious Disease

## 2011-09-13 LAB — CD4/CD8 (T-HELPER/T-SUPPRESSOR CELL)
CD4%: 48.7
CD8 % Suppressor T Cell: 30.1
CD8: 452

## 2011-12-24 ENCOUNTER — Encounter: Payer: Self-pay | Admitting: *Deleted

## 2011-12-26 ENCOUNTER — Ambulatory Visit (INDEPENDENT_AMBULATORY_CARE_PROVIDER_SITE_OTHER): Payer: BC Managed Care – PPO | Admitting: *Deleted

## 2011-12-26 VITALS — BP 133/83 | HR 83 | Temp 99.6°F | Resp 16 | Wt 272.0 lb

## 2011-12-26 DIAGNOSIS — B20 Human immunodeficiency virus [HIV] disease: Secondary | ICD-10-CM

## 2011-12-26 DIAGNOSIS — Z21 Asymptomatic human immunodeficiency virus [HIV] infection status: Secondary | ICD-10-CM

## 2011-12-26 LAB — POCT URINALYSIS DIPSTICK
Nitrite, UA: NEGATIVE
Protein, UA: NEGATIVE
Spec Grav, UA: 1.025
Urobilinogen, UA: 0.2

## 2011-12-26 LAB — BASIC METABOLIC PANEL
BUN: 8 mg/dL (ref 6–23)
Chloride: 103 mEq/L (ref 96–112)
Creat: 0.66 mg/dL (ref 0.50–1.10)

## 2011-12-26 NOTE — Progress Notes (Signed)
Patient here for 8 month START study appt. Denies any complaints and says she has been very adherent with her meds. She has an appt to see Dr. Daiva Eves in a month.

## 2011-12-28 LAB — HIV-1 RNA QUANT-NO REFLEX-BLD: HIV 1 RNA Quant: <20 copies/mL (ref <20)

## 2012-01-23 ENCOUNTER — Ambulatory Visit: Payer: BC Managed Care – PPO | Admitting: Infectious Disease

## 2012-01-31 ENCOUNTER — Encounter: Payer: Self-pay | Admitting: Infectious Disease

## 2012-01-31 LAB — CD4/CD8 (T-HELPER/T-SUPPRESSOR CELL): CD4%: 44.3

## 2012-02-18 ENCOUNTER — Ambulatory Visit: Payer: Self-pay | Admitting: *Deleted

## 2012-02-19 ENCOUNTER — Ambulatory Visit (INDEPENDENT_AMBULATORY_CARE_PROVIDER_SITE_OTHER): Payer: Self-pay | Admitting: *Deleted

## 2012-02-19 DIAGNOSIS — B20 Human immunodeficiency virus [HIV] disease: Secondary | ICD-10-CM

## 2012-02-19 NOTE — Progress Notes (Signed)
Patient here to get urine specimen for study only.  Was given and explained letter of amendment to protocol allowing for increase in number of participants.

## 2012-02-19 NOTE — Progress Notes (Signed)
Patient here to get urine specimen for study only. She was also given and explained the letter from [facility] regarding an increase in number of enrollment.

## 2012-03-13 ENCOUNTER — Telehealth: Payer: Self-pay | Admitting: *Deleted

## 2012-03-13 NOTE — Telephone Encounter (Signed)
Message left to call [facility] to make PAP smear appt.

## 2012-03-14 ENCOUNTER — Ambulatory Visit (INDEPENDENT_AMBULATORY_CARE_PROVIDER_SITE_OTHER): Payer: BC Managed Care – PPO | Admitting: Internal Medicine

## 2012-03-14 VITALS — BP 120/82 | HR 76 | Temp 98.5°F | Resp 16 | Ht 62.25 in | Wt 262.0 lb

## 2012-03-14 DIAGNOSIS — K645 Perianal venous thrombosis: Secondary | ICD-10-CM

## 2012-03-14 DIAGNOSIS — K6289 Other specified diseases of anus and rectum: Secondary | ICD-10-CM

## 2012-03-14 MED ORDER — HYDROCORTISONE ACETATE 25 MG RE SUPP: 25.0000 mg | Freq: Two times a day (BID) | RECTAL | Status: AC

## 2012-03-14 NOTE — Patient Instructions (Signed)
Take a stool softener for 3-5 days along with the suppositories for your hemorrhoids as needed.  Avoid getting constipated!  Return to our clinic if your hemorrhoids worsen. Hemorrhoids Hemorrhoids are enlarged (dilated) veins around the rectum. There are 2 types of hemorrhoids, and the type of hemorrhoid is determined by its location. Internal hemorrhoids occur in the veins just inside the rectum.They are usually not painful, but they may bleed.However, they may poke through to the outside and become irritated and painful. External hemorrhoids involve the veins outside the anus and can be felt as a painful swelling or hard lump near the anus.They are often itchy and may crack and bleed. Sometimes clots will form in the veins. This makes them swollen and painful. These are called thrombosed hemorrhoids. CAUSES Causes of hemorrhoids include:  Pregnancy. This increases the pressure in the hemorrhoidal veins.   Constipation.   Straining to have a bowel movement.   Obesity.   Heavy lifting or other activity that caused you to strain.  TREATMENT Most of the time hemorrhoids improve in 1 to 2 weeks. However, if symptoms do not seem to be getting better or if you have a lot of rectal bleeding, your caregiver may perform a procedure to help make the hemorrhoids get smaller or remove them completely.Possible treatments include:  Rubber band ligation. A rubber band is placed at the base of the hemorrhoid to cut off the circulation.   Sclerotherapy. A chemical is injected to shrink the hemorrhoid.   Infrared light therapy. Tools are used to burn the hemorrhoid.   Hemorrhoidectomy. This is surgical removal of the hemorrhoid.  HOME CARE INSTRUCTIONS   Increase fiber in your diet. Ask your caregiver about using fiber supplements.   Drink enough water and fluids to keep your urine clear or pale yellow.   Exercise regularly.   Go to the bathroom when you have the urge to have a bowel movement.  Do not wait.   Avoid straining to have bowel movements.   Keep the anal area dry and clean.   Only take over-the-counter or prescription medicines for pain, discomfort, or fever as directed by your caregiver.  If your hemorrhoids are thrombosed:  Take warm sitz baths for 20 to 30 minutes, 3 to 4 times per day.   If the hemorrhoids are very tender and swollen, place ice packs on the area as tolerated. Using ice packs between sitz baths may be helpful. Fill a plastic bag with ice. Place a towel between the bag of ice and your skin.   Medicated creams and suppositories may be used or applied as directed.   Do not use a donut-shaped pillow or sit on the toilet for long periods. This increases blood pooling and pain.  SEEK MEDICAL CARE IF:   You have increasing pain and swelling that is not controlled with your medicine.   You have uncontrolled bleeding.   You have difficulty or you are unable to have a bowel movement.   You have pain or inflammation outside the area of the hemorrhoids.   You have chills or an oral temperature above 102 F (38.9 C).  MAKE SURE YOU:   Understand these instructions.   Will watch your condition.   Will get help right away if you are not doing well or get worse.  Document Released: 11/02/2000 Document Revised: 10/25/2011 Document Reviewed: 03/09/2008 [facility] Patient Information 2012 [place], [place].

## 2012-03-14 NOTE — Progress Notes (Signed)
  Subjective:    Patient ID: Regina Mcdonald, female    DOB: 1971-11-16, 41 y.o.   MRN: [id number]  HPI  Regina Mcdonald is a 41 year old AA female that is here for painful, itching hemorrhoids.  She has had these for years but lately they have been irritating her.  She denies constipation.  She takes her Atripla regularly and feels well, tells me her viral counts have been good when checked.  She has not had any significant diarrhea or bowel changes.    Review of Systems  Gastrointestinal: Positive for rectal pain.  All other systems reviewed and are negative.  negative except as noted in HPI     Objective:   Physical Exam  Vitals reviewed. Constitutional: She is oriented to person, place, and time. She appears well-developed and well-nourished.  HENT:  Head: Normocephalic.  Neck: Neck supple.  Cardiovascular: Normal rate, regular rhythm and normal heart sounds.   Pulmonary/Chest: Effort normal and breath sounds normal.  Genitourinary:       External thrombosed hemorrhoid, several small non-thrombosed hemorrhoids present.  Neurological: She is alert and oriented to person, place, and time.  Skin: Skin is warm and dry.  Psychiatric: She has a normal mood and affect. Her behavior is normal.   Procedure Note:  VCO.  1cc of 1% lidocaine with epinephrine injected into external hemorrhoid and #15 blade used to open and remove coagulated blood.  Gentle pressure applied for hemostasis and gauze dressing applied.  Pt tol well.       Assessment & Plan:  External thrombosed hemorrhoid opened and drained.  Anusol HC suppositories BID for a week.  Stool softeners to avoid constipation and plenty of water.  Return if symptoms worsen.  AVS with instructions and care printed and given pt.

## 2012-03-17 ENCOUNTER — Ambulatory Visit (INDEPENDENT_AMBULATORY_CARE_PROVIDER_SITE_OTHER): Payer: BC Managed Care – PPO | Admitting: Emergency Medicine

## 2012-03-17 DIAGNOSIS — K6289 Other specified diseases of anus and rectum: Secondary | ICD-10-CM

## 2012-03-17 DIAGNOSIS — K644 Residual hemorrhoidal skin tags: Secondary | ICD-10-CM

## 2012-03-17 MED ORDER — HYDROCORTISONE 2.5 % RE CREA: TOPICAL_CREAM | Freq: Two times a day (BID) | RECTAL | Status: AC

## 2012-03-17 NOTE — Progress Notes (Signed)
  Subjective:    Patient ID: Regina Mcdonald, female    DOB: 03/09/1971, 41 y.o.   MRN: [id number]  HPI Doddy and had I&D of a thrombosed hemorrhoid. She now has developed swelling at the I&D site. She overall is doing okay except his persistent pain at the I&D site    Review of Systems     Objective:   Physical Exam examination reveals 2 fairly large skin tags. They measure about 1-1/2 cm per is right at the perianal area the there is no evidence of recurrent thrombosed hemorrhoid and there is no evidence of a perirectal abscess        Assessment & Plan:  Patient with external hemorrhoidal disease. We'll treat with Anusol-HC 2-1/2% is to be called to the pharmacy. She is to call if she continues to have problems we will make referral to surgeon.

## 2012-03-25 ENCOUNTER — Telehealth: Payer: Self-pay | Admitting: *Deleted

## 2012-03-25 NOTE — Telephone Encounter (Signed)
Message left requesting pt to call [facility] to scheduled appts for Dr. Orvan Falconer and for annual PAP smear.

## 2012-04-04 ENCOUNTER — Ambulatory Visit (INDEPENDENT_AMBULATORY_CARE_PROVIDER_SITE_OTHER): Payer: BC Managed Care – PPO | Admitting: *Deleted

## 2012-04-04 DIAGNOSIS — Z0000 Encounter for general adult medical examination without abnormal findings: Secondary | ICD-10-CM

## 2012-04-04 DIAGNOSIS — Z124 Encounter for screening for malignant neoplasm of cervix: Secondary | ICD-10-CM

## 2012-04-04 NOTE — Patient Instructions (Signed)
  Your results will be ready in about a week.  I will mail them to you.  Don't forget to call [facility] to make your mammogram appointment, [phone].  Thank you for coming to the Center for your care.  Angelique Blonder

## 2012-04-04 NOTE — Progress Notes (Signed)
  Subjective:     Regina Mcdonald is a 41 y.o. woman who comes in today for a  pap smear only. Contraception: IUD, condoms.  Objective:   LMP:  02/26/12  Pelvic Exam:  Pap smear obtained.   Assessment:    Screening pap smear.   Plan:    Follow up in one year, or as indicated by Pap results.  Pt given condoms. Pt given educational materials re: HIV and women, PAP smear, BSE, Mammograms, diet, nutrition, heart disease, exercise, self-esteem.

## 2012-04-09 ENCOUNTER — Encounter: Payer: Self-pay | Admitting: *Deleted

## 2012-04-22 ENCOUNTER — Ambulatory Visit: Payer: BC Managed Care – PPO

## 2012-05-06 ENCOUNTER — Telehealth: Payer: Self-pay | Admitting: *Deleted

## 2012-05-06 ENCOUNTER — Ambulatory Visit (INDEPENDENT_AMBULATORY_CARE_PROVIDER_SITE_OTHER): Payer: BC Managed Care – PPO | Admitting: *Deleted

## 2012-05-06 VITALS — BP 121/79 | HR 77 | Temp 98.9°F | Resp 16 | Wt 256.8 lb

## 2012-05-06 DIAGNOSIS — Z21 Asymptomatic human immunodeficiency virus [HIV] infection status: Secondary | ICD-10-CM

## 2012-05-06 DIAGNOSIS — B20 Human immunodeficiency virus [HIV] disease: Secondary | ICD-10-CM

## 2012-05-06 LAB — LIPID PANEL: Cholesterol: 168 mg/dL (ref 0–200)

## 2012-05-06 LAB — POCT URINALYSIS DIPSTICK
Glucose, UA: NEGATIVE
Ketones, UA: NEGATIVE
Spec Grav, UA: 1.02

## 2012-05-06 LAB — COMPREHENSIVE METABOLIC PANEL
ALT: 14 U/L (ref 0–35)
CO2: 24 mEq/L (ref 19–32)
Calcium: 9.2 mg/dL (ref 8.4–10.5)
Chloride: 106 mEq/L (ref 96–112)
Sodium: 140 mEq/L (ref 135–145)
Total Protein: 7.2 g/dL (ref 6.0–8.3)

## 2012-05-06 NOTE — Progress Notes (Signed)
Pt here for START study, 12 month visit. Discussed letter to study participants about findings of higher mortality rate in those whose CD4 350-500 vs >500. Answered any questions she had; pt verbalized understanding, signed letter, and received copy of letter. Assessment unchanged since last study visit. Pt states she has not missed one pill of Atripla since last study visit. Fasting labs were drawn; resting EKG performed with NSR results. Pt received $20.00 gift card for study visit. ARV medications were dispensed. Next appointment scheduled for Thursday, August 21, 2012 @ 9am. Tacey Heap RN

## 2012-05-06 NOTE — Telephone Encounter (Signed)
I left her a message asking her to call back & make an appt

## 2012-05-07 LAB — HIV-1 RNA QUANT-NO REFLEX-BLD: HIV-1 RNA Quant, Log: <1.3 {Log} (ref <1.30)

## 2012-05-16 ENCOUNTER — Encounter: Payer: Self-pay | Admitting: Infectious Disease

## 2012-05-16 LAB — CD4/CD8 (T-HELPER/T-SUPPRESSOR CELL)
CD4%: 47.1
CD4: 612
CD8 % Suppressor T Cell: 27.6

## 2012-05-23 ENCOUNTER — Encounter: Payer: Self-pay | Admitting: Infectious Disease

## 2012-09-19 ENCOUNTER — Ambulatory Visit (INDEPENDENT_AMBULATORY_CARE_PROVIDER_SITE_OTHER): Payer: BC Managed Care – PPO

## 2012-09-19 ENCOUNTER — Ambulatory Visit: Payer: BC Managed Care – PPO | Admitting: *Deleted

## 2012-09-19 ENCOUNTER — Encounter (INDEPENDENT_AMBULATORY_CARE_PROVIDER_SITE_OTHER): Payer: Self-pay | Admitting: *Deleted

## 2012-09-19 VITALS — BP 120/84 | HR 82 | Temp 98.5°F | Resp 18 | Wt 260.8 lb

## 2012-09-19 DIAGNOSIS — B20 Human immunodeficiency virus [HIV] disease: Secondary | ICD-10-CM

## 2012-09-19 DIAGNOSIS — Z23 Encounter for immunization: Secondary | ICD-10-CM

## 2012-09-19 NOTE — Progress Notes (Signed)
Patient here for her 16 month START study appt. She denies any current problems. She says she has been very adherent with her meds. She was given a flushot while she was here for the visit.

## 2012-09-22 LAB — HIV-1 RNA QUANT-NO REFLEX-BLD: HIV 1 RNA Quant: <20 copies/mL (ref <20)

## 2012-09-24 ENCOUNTER — Encounter: Payer: Self-pay | Admitting: Infectious Disease

## 2012-09-24 LAB — CD4/CD8 (T-HELPER/T-SUPPRESSOR CELL): CD8: 427

## 2012-10-15 ENCOUNTER — Encounter: Payer: Self-pay | Admitting: Infectious Disease

## 2012-11-22 ENCOUNTER — Ambulatory Visit (INDEPENDENT_AMBULATORY_CARE_PROVIDER_SITE_OTHER): Payer: BC Managed Care – PPO | Admitting: Family Medicine

## 2012-11-22 ENCOUNTER — Ambulatory Visit: Payer: BC Managed Care – PPO

## 2012-11-22 VITALS — BP 128/88 | HR 92 | Temp 102.1°F | Resp 17 | Ht 62.0 in | Wt 247.0 lb

## 2012-11-22 DIAGNOSIS — R509 Fever, unspecified: Secondary | ICD-10-CM

## 2012-11-22 DIAGNOSIS — J189 Pneumonia, unspecified organism: Secondary | ICD-10-CM

## 2012-11-22 DIAGNOSIS — R05 Cough: Secondary | ICD-10-CM

## 2012-11-22 LAB — POCT CBC
HCT, POC: 42.2 % (ref 37.7–47.9)
MCH, POC: 29.4 pg (ref 27–31.2)
MCV: 96.9 fL (ref 80–97)
MID (cbc): 1 — AB (ref 0–0.9)
POC LYMPH PERCENT: 14.7 %L (ref 10–50)
Platelet Count, POC: 287 10*3/uL (ref 142–424)
RBC: 4.35 M/uL (ref 4.04–5.48)
WBC: 12.9 10*3/uL — AB (ref 4.6–10.2)

## 2012-11-22 LAB — POCT INFLUENZA A/B: Influenza B, POC: NEGATIVE

## 2012-11-22 MED ORDER — CEFDINIR 300 MG PO CAPS: 300.0000 mg | ORAL_CAPSULE | Freq: Two times a day (BID) | ORAL | Status: DC

## 2012-11-22 NOTE — Patient Instructions (Addendum)
Use the antibiotic as directed.  Keep your fever down with ibuprofen and tylenol as needed. Let me know if you are not doing better within 48 hours- Sooner if worse.

## 2012-11-22 NOTE — Progress Notes (Signed)
[facility] [address], [place] [address] [phone]  Date:  11/22/2012   Name:  Regina Mcdonald   DOB:  03-31-1971   MRN:  [id number]  PCP:  Regina Lav, MD    Chief Complaint: Fever, Cough and sinus congestion   History of Present Illness:  Regina Mcdonald is a 42 y.o. very pleasant female patient who presents with the following:  Here today with illness.  History of HIV- most recent CD4 and HIV numbers were ok in November.  She noted the onset of illness yersterday- she had body aches, felt tired.  She also noted a fever up to 102 yesterday.  She also notes a cough and sinus congestion.     She did have a flu shot in November  She is on atripla for her HIV and is compliant with her medication.   Patient Active Problem List  Diagnosis  . HIV INFECTION  . OVERWEIGHT  . ASTHMA NOS W/O STATUS ASTHMATICUS  . ELEVATED BLOOD PRESSURE    History reviewed. No pertinent past medical history.  History reviewed. No pertinent past surgical history.  History  Substance Use Topics  . Smoking status: Never Smoker   . Smokeless tobacco: Not on file  . Alcohol Use: No    History reviewed. No pertinent family history.  No Known Allergies  Medication list has been reviewed and updated.  Current Outpatient Prescriptions on File Prior to Visit  Medication Sig Dispense Refill  . albuterol (PROVENTIL) 90 MCG/ACT inhaler Inhale 1 puff into the lungs every 6 (six) hours as needed.        . meloxicam (MOBIC) 7.5 MG tablet         Review of Systems:  As per HPI- otherwise negative.   Physical Examination: Filed Vitals:   11/22/12 1217  BP: 128/88  Pulse: 92  Temp: 102.1 F (38.9 C)  Resp: 17   Filed Vitals:   11/22/12 1217  Height: 5\' 2"  (1.575 m)  Weight: 247 lb (112.038 kg)   Body mass index is 45.18 kg/(m^2). Ideal Body Weight: Weight in (lb) to have BMI = 25: 136.4   GEN: WDWN, NAD, Non-toxic, A & O x 3, obese- does not appear  ill HEENT: Atraumatic, Normocephalic. Neck supple. No masses, No LAD. Bilateral TM wnl, oropharynx normal.  PEERL,EOMI.   Ears and Nose: No external deformity. CV: RRR, No M/G/R. No JVD. No thrill. No extra heart sounds. PULM: CTA B, no wheezes, crackles, rhonchi. No retractions. No resp. distress. No accessory muscle use. ABD: S, NT, ND, +BS. No rebound. No HSM. EXTR: No c/c/e NEURO Normal gait.  PSYCH: Normally interactive. Conversant. Not depressed or anxious appearing.  Calm demeanor.   600 mg of ibuprofen at 1:06 pm- temp 101.6 at 1:41 pm  Results for orders placed in visit on 11/22/12  POCT INFLUENZA A/B      Component Value Range   Influenza A, POC Negative     Influenza B, POC Negative    POCT CBC      Component Value Range   WBC 12.9 (*) 4.6 - 10.2 K/uL   Lymph, poc 1.9  0.6 - 3.4   POC LYMPH PERCENT 14.7  10 - 50 %L   MID (cbc) 1.0 (*) 0 - 0.9   POC MID % 7.7  0 - 12 %M   POC Granulocyte 10.0 (*) 2 - 6.9   Granulocyte percent 77.6  37 - 80 %G  RBC 4.35  4.04 - 5.48 M/uL   Hemoglobin 12.8  12.2 - 16.2 g/dL   HCT, POC 96.0  45.4 - 47.9 %   MCV 96.9  80 - 97 fL   MCH, POC 29.4  27 - 31.2 pg   MCHC 30.3 (*) 31.8 - 35.4 g/dL   RDW, POC 09.8     Platelet Count, POC 287  142 - 424 K/uL   MPV 9.0  0 - 99.8 fL   [facility] reading (PRIMARY) by  Dr. Patsy Lager. CXR: mild right sided pneumonia  CHEST - 2 VIEW  Comparison: None.  Findings: The heart, mediastinum and hila are within normal limits. The lungs are clear. The bony thorax is intact.  IMPRESSION: No active disease of the chest.  Spoke with the ID doctor on call- as Regina Mcdonald has normal CD4 counts we can proceed as per normal, but will caution her to be vigilant for any worsening.    Assessment and Plan: 1. Pneumonia  cefdinir (OMNICEF) 300 MG capsule  2. Fever  POCT Influenza A/B, POCT CBC, DG Chest 2 View  3. Cough  DG Chest 2 View   Pneumonia with well- controlled HIV diease.  Will treat with omnicef.  Instructed  Regina Mcdonald to seek help right away if she feels that she is worsening or not better in the next 2 days  COPLAND,JESSICA, MD

## 2012-11-24 ENCOUNTER — Ambulatory Visit: Payer: BC Managed Care – PPO | Admitting: Infectious Disease

## 2012-11-24 ENCOUNTER — Telehealth: Payer: Self-pay | Admitting: Family Medicine

## 2012-11-24 NOTE — Telephone Encounter (Signed)
LMOM- chest x-ray read as normal but clinically I think pneumonia.  Let me know if not better

## 2012-12-01 ENCOUNTER — Ambulatory Visit: Payer: Self-pay | Admitting: Infectious Disease

## 2012-12-04 ENCOUNTER — Encounter: Payer: Self-pay | Admitting: Infectious Disease

## 2012-12-04 ENCOUNTER — Ambulatory Visit (INDEPENDENT_AMBULATORY_CARE_PROVIDER_SITE_OTHER): Payer: BC Managed Care – PPO | Admitting: Infectious Disease

## 2012-12-04 VITALS — BP 151/93 | HR 75 | Temp 98.4°F | Wt 249.0 lb

## 2012-12-04 DIAGNOSIS — R030 Elevated blood-pressure reading, without diagnosis of hypertension: Secondary | ICD-10-CM

## 2012-12-04 DIAGNOSIS — Z23 Encounter for immunization: Secondary | ICD-10-CM

## 2012-12-04 DIAGNOSIS — B20 Human immunodeficiency virus [HIV] disease: Secondary | ICD-10-CM

## 2012-12-04 DIAGNOSIS — E669 Obesity, unspecified: Secondary | ICD-10-CM

## 2012-12-04 NOTE — Progress Notes (Signed)
  Subjective:    Patient ID: Regina Mcdonald, female    DOB: [birth year], 42 y.o.   MRN: [id number]  HPI  Regina Mcdonald is a 42 y.o. female with HIV infection who is doing superbly well on their  antiviral regimen, Atripla--which she receives via START trial with undetectable viral load and health cd4 count. [name] sig Atripla at night she takes it later in night or early in the morning and this makes her feel very strange throughout the day. We discussed other possible single tablet regimens including STRIBILD and complera. I spent greater than 45 minutes with the patient including greater than 50% of time in face to face counsel of the patient and in coordination of their care.    Review of Systems  Constitutional: Negative for fever, chills, diaphoresis, activity change, appetite change, fatigue and unexpected weight change.  HENT: Negative for congestion, sore throat, rhinorrhea, sneezing, trouble swallowing and sinus pressure.   Eyes: Negative for photophobia and visual disturbance.  Respiratory: Negative for cough, chest tightness, shortness of breath, wheezing and stridor.   Cardiovascular: Negative for chest pain, palpitations and leg swelling.  Gastrointestinal: Negative for nausea, vomiting, abdominal pain, diarrhea, constipation, blood in stool, abdominal distention and anal bleeding.  Genitourinary: Negative for dysuria, hematuria, flank pain and difficulty urinating.  Musculoskeletal: Negative for myalgias, back pain, joint swelling, arthralgias and gait problem.  Skin: Negative for color change, pallor, rash and wound.  Neurological: Negative for dizziness, tremors, weakness and light-headedness.  Hematological: Negative for adenopathy. Does not bruise/bleed easily.  Psychiatric/Behavioral: Negative for behavioral problems, confusion, sleep disturbance, dysphoric mood, decreased concentration and agitation.       Objective:   Physical Exam  Constitutional: She is  oriented to person, place, and time. She appears well-developed and well-nourished. No distress.  HENT:  Head: Normocephalic and atraumatic.  Mouth/Throat: Oropharynx is clear and moist. No oropharyngeal exudate.  Eyes: Conjunctivae normal and EOM are normal. Pupils are equal, round, and reactive to light. No scleral icterus.  Neck: Normal range of motion. Neck supple. No JVD present.  Cardiovascular: Normal rate, regular rhythm and normal heart sounds.  Exam reveals no gallop and no friction rub.   No murmur heard. Pulmonary/Chest: Effort normal and breath sounds normal. No respiratory distress. She has no wheezes. She has no rales. She exhibits no tenderness.  Abdominal: She exhibits no distension and no mass. There is no tenderness. There is no rebound and no guarding.  Musculoskeletal: She exhibits no edema and no tenderness.  Lymphadenopathy:    She has no cervical adenopathy.  Neurological: She is alert and oriented to person, place, and time. She has normal reflexes. She exhibits normal muscle tone. Coordination normal.  Skin: Skin is warm and dry. She is not diaphoretic. No erythema. No pallor.  Psychiatric: She has a normal mood and affect. Her behavior is normal. Judgment and thought content normal.          Assessment & Plan:  HIV: continue ARV via START. She can consider change to complera or STRIBILD which both should be available via START  HTN; check microalbumin to creatinine ratio  Obesity: Encouraged exercise and diet

## 2012-12-05 LAB — MICROALBUMIN / CREATININE URINE RATIO: Microalb Creat Ratio: 9.4 mg/g (ref 0.0–30.0)

## 2012-12-15 ENCOUNTER — Ambulatory Visit: Payer: Self-pay

## 2013-02-05 ENCOUNTER — Encounter: Payer: Self-pay | Admitting: Infectious Disease

## 2013-03-03 ENCOUNTER — Ambulatory Visit (INDEPENDENT_AMBULATORY_CARE_PROVIDER_SITE_OTHER): Payer: Self-pay | Admitting: *Deleted

## 2013-03-03 VITALS — BP 123/84 | HR 82 | Temp 98.6°F | Resp 16 | Wt 279.0 lb

## 2013-03-03 DIAGNOSIS — Z21 Asymptomatic human immunodeficiency virus [HIV] infection status: Secondary | ICD-10-CM

## 2013-03-03 DIAGNOSIS — B20 Human immunodeficiency virus [HIV] disease: Secondary | ICD-10-CM

## 2013-03-03 NOTE — Progress Notes (Signed)
Patient is here for her 20 month START study visit. She denies any current problems and says she has been taking her atripla every night. She says she was ill with the flu in January which lasted about a week. She will return in July for her next study visit.

## 2013-03-05 LAB — HIV-1 RNA QUANT-NO REFLEX-BLD: HIV-1 RNA Quant, Log: <1.3 {Log} (ref <1.30)

## 2013-03-09 ENCOUNTER — Encounter: Payer: Self-pay | Admitting: *Deleted

## 2013-03-19 ENCOUNTER — Encounter: Payer: Self-pay | Admitting: Infectious Disease

## 2013-03-19 LAB — CD4/CD8 (T-HELPER/T-SUPPRESSOR CELL)
CD4: 977
CD8 % Suppressor T Cell: 24.8
CD8: 471

## 2013-04-02 ENCOUNTER — Telehealth: Payer: Self-pay | Admitting: *Deleted

## 2013-04-02 NOTE — Telephone Encounter (Signed)
Message left requesting pt call [facility] for PAP smear appt.

## 2013-04-09 ENCOUNTER — Encounter: Payer: Self-pay | Admitting: Infectious Disease

## 2013-05-19 ENCOUNTER — Telehealth: Payer: Self-pay | Admitting: *Deleted

## 2013-05-19 NOTE — Telephone Encounter (Signed)
Left message offering to do PAP smear next week following her Research Visit.  Pt could also choose to make regular PAP smear appt.

## 2013-05-20 ENCOUNTER — Telehealth: Payer: Self-pay | Admitting: *Deleted

## 2013-05-20 NOTE — Telephone Encounter (Signed)
Pt agreed to have PAP smear 05/27/13 after Research OV

## 2013-05-27 ENCOUNTER — Ambulatory Visit (INDEPENDENT_AMBULATORY_CARE_PROVIDER_SITE_OTHER): Payer: Self-pay | Admitting: *Deleted

## 2013-05-27 VITALS — BP 129/84 | HR 72 | Temp 98.3°F | Resp 16 | Ht 62.0 in | Wt 265.8 lb

## 2013-05-27 DIAGNOSIS — Z113 Encounter for screening for infections with a predominantly sexual mode of transmission: Secondary | ICD-10-CM

## 2013-05-27 DIAGNOSIS — B20 Human immunodeficiency virus [HIV] disease: Secondary | ICD-10-CM

## 2013-05-27 DIAGNOSIS — Z124 Encounter for screening for malignant neoplasm of cervix: Secondary | ICD-10-CM

## 2013-05-27 DIAGNOSIS — Z21 Asymptomatic human immunodeficiency virus [HIV] infection status: Secondary | ICD-10-CM

## 2013-05-27 LAB — POCT URINALYSIS DIPSTICK
Glucose, UA: NEGATIVE
Leukocytes, UA: NEGATIVE
Nitrite, UA: NEGATIVE
Protein, UA: NEGATIVE
Spec Grav, UA: 1.025
Urobilinogen, UA: 0.2

## 2013-05-27 LAB — COMPREHENSIVE METABOLIC PANEL
Albumin: 4.1 g/dL (ref 3.5–5.2)
BUN: 10 mg/dL (ref 6–23)
CO2: 24 mEq/L (ref 19–32)
Calcium: 8.6 mg/dL (ref 8.4–10.5)
Glucose, Bld: 90 mg/dL (ref 70–99)
Potassium: 3.9 mEq/L (ref 3.5–5.3)
Sodium: 136 mEq/L (ref 135–145)
Total Protein: 6.8 g/dL (ref 6.0–8.3)

## 2013-05-27 LAB — LIPID PANEL: Cholesterol: 148 mg/dL (ref 0–200)

## 2013-05-27 NOTE — Patient Instructions (Addendum)
  Your results will be ready in about a week.  They will be available on MyChart and I will send you a letter.  Thank you for coming to the Center for your care.  Angelique Blonder, Charity fundraiser.

## 2013-05-27 NOTE — Progress Notes (Signed)
  Subjective:     Regina Mcdonald is a 42 y.o. woman who comes in today for a  pap smear only.  Previous abnormal Pap smears: no. Contraception:  condoms  Objective:    There were no vitals taken for this visit. Pelvic Exam:  Pap smear obtained.   Assessment:    Screening pap smear.   Plan:    Follow up in one year, or as indicated by Pap results.  Pt given educational materials re: HIV and diet, nutrition, exercise, BSE, health promotion, self-esteem and PAP smears. Pt given condoms.  Advised pt to call [facility] for a mammogram appt.  Pt given telephone number.  Explained the value of a baseline mammogram.

## 2013-05-27 NOTE — Progress Notes (Signed)
Pt here for START visit, 24 months. She denies any new signs, symptoms, or problems. Vital signs are WNL. Fasting labs were drawn. EKG obtained. She received study medications and $20 gift card for study visit. Next appointment scheduled for Tuesday, September 22, 2013 @ 9am. Tacey Heap RN

## 2013-05-28 LAB — HIV-1 RNA QUANT-NO REFLEX-BLD: HIV 1 RNA Quant: <20 copies/mL (ref <20)

## 2013-05-29 ENCOUNTER — Encounter: Payer: Self-pay | Admitting: *Deleted

## 2013-06-03 ENCOUNTER — Encounter: Payer: Self-pay | Admitting: Infectious Disease

## 2013-06-03 LAB — CD4/CD8 (T-HELPER/T-SUPPRESSOR CELL): CD8: 347

## 2013-06-16 ENCOUNTER — Encounter: Payer: Self-pay | Admitting: Infectious Disease

## 2013-06-17 NOTE — Addendum Note (Signed)
Addended by: Jennet Maduro D on: 06/17/2013 09:42 AM   Modules accepted: Orders

## 2013-09-22 ENCOUNTER — Other Ambulatory Visit: Payer: BC Managed Care – PPO

## 2013-09-23 ENCOUNTER — Telehealth: Payer: Self-pay | Admitting: *Deleted

## 2013-09-23 NOTE — Telephone Encounter (Signed)
Requested pt call to schedule MD appt after "research" appt.  Pt needs to plan her time away from her job.

## 2013-10-01 ENCOUNTER — Ambulatory Visit (INDEPENDENT_AMBULATORY_CARE_PROVIDER_SITE_OTHER): Payer: BC Managed Care – PPO | Admitting: Family Medicine

## 2013-10-01 VITALS — BP 154/92 | HR 79 | Temp 98.8°F | Resp 17 | Ht 63.0 in | Wt 276.0 lb

## 2013-10-01 DIAGNOSIS — S39012A Strain of muscle, fascia and tendon of lower back, initial encounter: Secondary | ICD-10-CM

## 2013-10-01 DIAGNOSIS — IMO0002 Reserved for concepts with insufficient information to code with codable children: Secondary | ICD-10-CM

## 2013-10-01 MED ORDER — MELOXICAM 7.5 MG PO TABS: ORAL_TABLET | ORAL | Status: DC

## 2013-10-01 MED ORDER — ACETAMINOPHEN 500 MG PO TABS: 500.0000 mg | ORAL_TABLET | Freq: Four times a day (QID) | ORAL | Status: DC | PRN

## 2013-10-01 MED ORDER — CYCLOBENZAPRINE HCL 10 MG PO TABS: 10.0000 mg | ORAL_TABLET | Freq: Two times a day (BID) | ORAL | Status: DC | PRN

## 2013-10-01 NOTE — Patient Instructions (Signed)
Use the flexeril as needed for pain- only use this when you do not need to drive because it can make you sleepy.  You can also use the mobic as needed for pain during the day.    If you are not better in the next couple of days please let me know- Sooner if worse.

## 2013-10-01 NOTE — Progress Notes (Signed)
[facility] [address] [place] [address] [phone]  Date:  10/01/2013   Name:  Regina Mcdonald   DOB:  [birth year]   MRN:  [id number]  PCP:  Acey Lav, MD    Chief Complaint: Back Pain   History of Present Illness:  Regina Mcdonald is a 42 y.o. very pleasant female patient who presents with the following:  She has noted lower back pain for about 2 weeks.  If she is standing or laying down she is ok.  However sitting is painful.  She is not aware of any particular injury or overuse incident.  She has tried OTC medications such as goody powder for back and body, aleve, tylenol ES.  They have not helped her too much No CP, no SOB The pain does not radiate down her legs, no numbness or weakness, no bowel or bladder control issues.    No urinary sx.    LMP about 10 days ago Her HIV is under good control; Dr. Daiva Eves.  Excellent counts recently  Patient Active Problem List   Diagnosis Date Noted  . OVERWEIGHT 02/08/2009  . ELEVATED BLOOD PRESSURE 06/25/2008  . HIV INFECTION 08/07/2007  . ASTHMA NOS W/O STATUS ASTHMATICUS 08/07/2007    No past medical history on file.  No past surgical history on file.  History  Substance Use Topics  . Smoking status: Never Smoker   . Smokeless tobacco: Not on file  . Alcohol Use: No    No family history on file.  No Known Allergies  Medication list has been reviewed and updated.  Current Outpatient Prescriptions on File Prior to Visit  Medication Sig Dispense Refill  . efavirenz-emtricitabine-tenofovir (ATRIPLA) 600-200-300 MG per tablet Take 1 tablet by mouth at bedtime.      Marland Kitchen albuterol (PROVENTIL) 90 MCG/ACT inhaler Inhale 1 puff into the lungs every 6 (six) hours as needed.         No current facility-administered medications on file prior to visit.    Review of Systems:  As per HPI- otherwise negative.   Physical Examination: Filed Vitals:   10/01/13 1545  BP: 154/92  Pulse: 79   Temp: 98.8 F (37.1 C)  Resp: 17   Filed Vitals:   10/01/13 1545  Height: 5\' 3"  (1.6 m)  Weight: 276 lb (125.193 kg)   Body mass index is 48.9 kg/(m^2). Ideal Body Weight: Weight in (lb) to have BMI = 25: 140.8  GEN: WDWN, NAD, Non-toxic, A & O x 3, obese, looks well HEENT: Atraumatic, Normocephalic. Neck supple. No masses, No LAD. Ears and Nose: No external deformity. CV: RRR, No M/G/R. No JVD. No thrill. No extra heart sounds. PULM: CTA B, no wheezes, crackles, rhonchi. No retractions. No resp. distress. No accessory muscle use. EXTR: No c/c/e NEURO Normal gait.  PSYCH: Normally interactive. Conversant. Not depressed or anxious appearing.  Calm demeanor.  Back:  She has reproducible TTP in the left mid back muscles.  No rash, redness or vesicles. Normal flexion and extension, normal strength and sensation, normal DTR both LE.  Negative SLR bilaterally     Assessment and Plan: Back strain, initial encounter - Plan: cyclobenzaprine (FLEXERIL) 10 MG tablet, DISCONTINUED: meloxicam (MOBIC) 7.5 MG tablet   Back strain: will treat with flexeril.  Cautioned against using flexeril when she needs to drive.  Tylenol during the day when necessary If not better soon she will let me know.    Called pharmD at  [facility] and decided not to use mobic after all due to possible interaction with her atriptla.  [facility] and left detailed message on her VM  Advised her to watch her BP.  She wil do so  Signed Abbe Amsterdam, MD

## 2013-10-03 ENCOUNTER — Telehealth: Payer: Self-pay

## 2013-10-03 NOTE — Telephone Encounter (Signed)
Pt requests something stronger than Tylenol 3 for pain. States she cant keep taking muscle relaxer in day time b/c it makes her sleepy. Saw dr Patsy Lager  Best: [phone]  bf

## 2013-10-04 NOTE — Telephone Encounter (Signed)
Called- she did not answer so LMOM.  I did not give her tylenol 3, I gave her flexeril.  Does she have tylenol 3 from elsewhere or was she thinking of something else?  Please give [place] a call back and clarify.  If she does need something stronger such as a hydrocodone product I will have to give her a physical rx due to new scheduling regulations.

## 2013-10-14 ENCOUNTER — Ambulatory Visit (INDEPENDENT_AMBULATORY_CARE_PROVIDER_SITE_OTHER): Payer: Self-pay | Admitting: *Deleted

## 2013-10-14 VITALS — BP 140/80 | HR 84 | Temp 98.1°F | Resp 16 | Wt 277.2 lb

## 2013-10-14 DIAGNOSIS — B20 Human immunodeficiency virus [HIV] disease: Secondary | ICD-10-CM

## 2013-10-14 DIAGNOSIS — Z21 Asymptomatic human immunodeficiency virus [HIV] infection status: Secondary | ICD-10-CM

## 2013-10-14 NOTE — Progress Notes (Signed)
Patient here for her 28 month START study appt. She recently hurt her lower back and it is still bothering her, mainly when she sits for a long period of time. She needs to schedule an appt to see Dr. Daiva Eves  And get set up with [name]. I gave her the list of required documents and she has the number to call to set up an appt with the counselor. We will wait on the flushot and other labs until that is approved.

## 2013-10-16 LAB — HIV-1 RNA QUANT-NO REFLEX-BLD
HIV 1 RNA Quant: <20 copies/mL (ref <20)
HIV-1 RNA Quant, Log: <1.3 {Log} (ref <1.30)

## 2013-11-06 ENCOUNTER — Encounter: Payer: Self-pay | Admitting: Infectious Disease

## 2013-11-06 LAB — CD4/CD8 (T-HELPER/T-SUPPRESSOR CELL): CD4: 837

## 2013-11-23 ENCOUNTER — Encounter: Payer: Self-pay | Admitting: Infectious Disease

## 2014-02-10 ENCOUNTER — Ambulatory Visit (INDEPENDENT_AMBULATORY_CARE_PROVIDER_SITE_OTHER): Payer: BC Managed Care – PPO | Admitting: *Deleted

## 2014-02-10 VITALS — BP 132/82 | HR 76 | Temp 98.4°F | Resp 18 | Wt 284.0 lb

## 2014-02-10 DIAGNOSIS — B20 Human immunodeficiency virus [HIV] disease: Secondary | ICD-10-CM

## 2014-02-10 DIAGNOSIS — Z21 Asymptomatic human immunodeficiency virus [HIV] infection status: Secondary | ICD-10-CM

## 2014-02-10 NOTE — Progress Notes (Signed)
Regina Mcdonald is here for START study, month 32. She states her lower back pain resolved in January and she stopped taking flexeril around that time. She denies any new symptoms or problems. Nonfasting labs were obtained and vital signs taken. She received $20 gift card for visit and Atripla study medication was dispensed. Next appointment scheduled for Wednesday, May 05, 2014 @ 1000. Tacey HeapElisha Epperson RN

## 2014-02-11 LAB — HIV-1 RNA QUANT-NO REFLEX-BLD

## 2014-03-08 ENCOUNTER — Encounter: Payer: Self-pay | Admitting: Infectious Disease

## 2014-03-08 LAB — CD4/CD8 (T-HELPER/T-SUPPRESSOR CELL)
CD4%: 54.3
CD4: 923
CD8 % Suppressor T Cell: 21.8
CD8: 371

## 2014-03-29 ENCOUNTER — Encounter: Payer: Self-pay | Admitting: Infectious Disease

## 2014-05-05 ENCOUNTER — Encounter: Payer: BC Managed Care – PPO | Admitting: *Deleted

## 2014-05-27 ENCOUNTER — Ambulatory Visit (INDEPENDENT_AMBULATORY_CARE_PROVIDER_SITE_OTHER): Payer: BC Managed Care – PPO | Admitting: *Deleted

## 2014-05-27 ENCOUNTER — Encounter: Payer: Self-pay | Admitting: *Deleted

## 2014-05-27 VITALS — BP 136/82 | HR 62 | Temp 98.5°F | Resp 14 | Wt 282.0 lb

## 2014-05-27 DIAGNOSIS — Z006 Encounter for examination for normal comparison and control in clinical research program: Secondary | ICD-10-CM

## 2014-05-27 DIAGNOSIS — B20 Human immunodeficiency virus [HIV] disease: Secondary | ICD-10-CM

## 2014-05-27 LAB — POCT URINALYSIS DIPSTICK
Bilirubin, UA: NEGATIVE
Glucose, UA: NEGATIVE
Ketones, UA: NEGATIVE
LEUKOCYTES UA: NEGATIVE
NITRITE UA: NEGATIVE
Protein, UA: NEGATIVE
Spec Grav, UA: 1.025
Urobilinogen, UA: 0.2
pH, UA: 6

## 2014-05-27 LAB — COMPREHENSIVE METABOLIC PANEL
ALBUMIN: 4.2 g/dL (ref 3.5–5.2)
ALT: 10 U/L (ref 0–35)
AST: 16 U/L (ref 0–37)
Alkaline Phosphatase: 48 U/L (ref 39–117)
BILIRUBIN TOTAL: 0.3 mg/dL (ref 0.2–1.2)
BUN: 9 mg/dL (ref 6–23)
CO2: 27 mEq/L (ref 19–32)
Calcium: 8.7 mg/dL (ref 8.4–10.5)
Chloride: 103 mEq/L (ref 96–112)
Creat: 0.64 mg/dL (ref 0.50–1.10)
GLUCOSE: 88 mg/dL (ref 70–99)
Potassium: 3.8 mEq/L (ref 3.5–5.3)
SODIUM: 139 meq/L (ref 135–145)
TOTAL PROTEIN: 6.8 g/dL (ref 6.0–8.3)

## 2014-05-27 LAB — LIPID PANEL
Cholesterol: 142 mg/dL (ref 0–200)
HDL: 43 mg/dL (ref >39)
LDL CALC: 81 mg/dL (ref 0–99)
Total CHOL/HDL Ratio: 3.3 Ratio
Triglycerides: 89 mg/dL (ref <150)
VLDL: 18 mg/dL (ref 0–40)

## 2014-05-27 NOTE — Progress Notes (Signed)
   Subjective:    Patient ID: Regina Mcdonald, female    DOB: 29-Sep-1971, 43 y.o.   MRN: [id number]  HPI    Review of Systems  Constitutional: Negative.   HENT: Negative.   Eyes: Negative.   Respiratory: Negative.   Cardiovascular: Negative.   Gastrointestinal: Negative.   Genitourinary: Negative.   Musculoskeletal: Negative.   Skin: Negative.   Neurological: Negative.   Psychiatric/Behavioral: Negative.        Objective:   Physical Exam  Constitutional: She is oriented to person, place, and time. She appears well-developed and well-nourished.  HENT:  Mouth/Throat: Oropharynx is clear and moist.  Cardiovascular: Normal rate, regular rhythm, normal heart sounds and intact distal pulses.   Pulmonary/Chest: Effort normal.  Abdominal: Soft. Bowel sounds are normal.  Musculoskeletal: Normal range of motion. She exhibits edema.  Lymphadenopathy:    She has no cervical adenopathy.  Neurological: She is alert and oriented to person, place, and time.  Skin: Skin is warm and dry.  Psychiatric: She has a normal mood and affect.          Assessment & Plan:  Regina Mcdonald is here for her START month 36 visit. She is also screening for Reprieve. Informed consent was obtained per SOP guidelines. She verbalized understanding that we are looking to see if the drug will help prevent cardiac events in the future for people with HIV. She also understands that she will be randomized to either the drug or a placebo. She denies any current problems, though she did say she notices she feels tired after she takes the [place], which she takes at night. I did tell her there were other options but she would need to come in and see Dr. Daiva EvesVan Dam to discuss. She is morbidly obese and has some pedal edema to both feet. If she is eligible for Reprieve we will have her come back in in the next few weeks for entry. Her next START appt is in October.

## 2014-05-28 LAB — HIV-1 RNA QUANT-NO REFLEX-BLD: HIV-1 RNA Quant, Log: <1.3 {Log} (ref <1.30)

## 2014-05-28 LAB — HCG, SERUM, QUALITATIVE: Preg, Serum: NEGATIVE

## 2014-06-10 ENCOUNTER — Encounter: Payer: Self-pay | Admitting: Infectious Disease

## 2014-06-10 LAB — CD4/CD8 (T-HELPER/T-SUPPRESSOR CELL)
CD4%: 53.6
CD4: 643
CD8 % Suppressor T Cell: 23.4
CD8: 281

## 2014-07-13 ENCOUNTER — Encounter: Payer: Self-pay | Admitting: *Deleted

## 2014-08-12 ENCOUNTER — Encounter: Payer: Self-pay | Admitting: *Deleted

## 2014-08-12 ENCOUNTER — Telehealth: Payer: Self-pay | Admitting: *Deleted

## 2014-08-12 NOTE — Telephone Encounter (Signed)
Time to make annual PAP smear appt

## 2014-08-16 ENCOUNTER — Encounter: Payer: Self-pay | Admitting: Infectious Disease

## 2014-08-31 ENCOUNTER — Ambulatory Visit (INDEPENDENT_AMBULATORY_CARE_PROVIDER_SITE_OTHER): Payer: Self-pay | Admitting: *Deleted

## 2014-08-31 VITALS — BP 136/89 | HR 79 | Temp 98.8°F | Resp 16 | Wt 280.5 lb

## 2014-08-31 DIAGNOSIS — Z006 Encounter for examination for normal comparison and control in clinical research program: Secondary | ICD-10-CM

## 2014-08-31 DIAGNOSIS — B20 Human immunodeficiency virus [HIV] disease: Secondary | ICD-10-CM

## 2014-08-31 LAB — CD4/CD8 (T-HELPER/T-SUPPRESSOR CELL)
CD4%: 49.5
CD4: 842
CD8 % Suppressor T Cell: 23.9
CD8: 406

## 2014-08-31 NOTE — Progress Notes (Signed)
Regina Mcdonald is here for her 40 month START study visit. She denies any new problems or concerns and says she has been very adherent. She needs to see Dr. Daiva EvesVan Dam and get a flu shot but her [name] has lapsed. She is working with [place] now to get that approved. We scheduled an appt for Dr. Daiva EvesVan Dam in November.

## 2014-09-01 ENCOUNTER — Encounter: Payer: Self-pay | Admitting: Infectious Disease

## 2014-09-01 LAB — HIV-1 RNA QUANT-NO REFLEX-BLD: HIV 1 RNA Quant: <20 copies/mL (ref <20)

## 2014-10-11 ENCOUNTER — Ambulatory Visit: Payer: Self-pay | Admitting: Infectious Disease

## 2015-01-05 ENCOUNTER — Ambulatory Visit (INDEPENDENT_AMBULATORY_CARE_PROVIDER_SITE_OTHER): Payer: Self-pay | Admitting: *Deleted

## 2015-01-05 VITALS — BP 137/78 | HR 82 | Temp 99.4°F | Resp 16 | Wt 277.8 lb

## 2015-01-05 DIAGNOSIS — Z006 Encounter for examination for normal comparison and control in clinical research program: Secondary | ICD-10-CM

## 2015-01-05 LAB — CD4/CD8 (T-HELPER/T-SUPPRESSOR CELL)
CD4%: 53.2
CD4: 798
CD8 % Suppressor T Cell: 21.5
CD8: 323

## 2015-01-05 NOTE — Progress Notes (Signed)
Regina Mcdonald is here for her Month 10844 START study visit. She denies any new problems or concerns about her medications. She needs to see dr. Daiva EvesVan Dam, so we scheduled an appt for next month. She knows that she needs to come in and do her [employer] White appl. before then. She will return in June for the next study visit.

## 2015-01-06 LAB — HIV-1 RNA QUANT-NO REFLEX-BLD: HIV 1 RNA Quant: <20 copies/mL (ref <20)

## 2015-01-19 ENCOUNTER — Encounter: Payer: Self-pay | Admitting: Infectious Disease

## 2015-02-09 ENCOUNTER — Ambulatory Visit: Payer: Self-pay | Admitting: Infectious Disease

## 2015-05-04 ENCOUNTER — Encounter (INDEPENDENT_AMBULATORY_CARE_PROVIDER_SITE_OTHER): Payer: Self-pay | Admitting: *Deleted

## 2015-05-04 VITALS — BP 140/81 | HR 82 | Temp 98.4°F | Resp 16 | Wt 278.5 lb

## 2015-05-04 DIAGNOSIS — Z006 Encounter for examination for normal comparison and control in clinical research program: Secondary | ICD-10-CM

## 2015-05-04 LAB — COMPREHENSIVE METABOLIC PANEL
ALK PHOS: 50 U/L (ref 39–117)
ALT: 12 U/L (ref 0–35)
AST: 17 U/L (ref 0–37)
Albumin: 3.9 g/dL (ref 3.5–5.2)
BUN: 10 mg/dL (ref 6–23)
CO2: 23 mEq/L (ref 19–32)
CREATININE: 0.66 mg/dL (ref 0.50–1.10)
Calcium: 8.5 mg/dL (ref 8.4–10.5)
Chloride: 108 mEq/L (ref 96–112)
Glucose, Bld: 89 mg/dL (ref 70–99)
Potassium: 4.5 mEq/L (ref 3.5–5.3)
Sodium: 140 mEq/L (ref 135–145)
Total Bilirubin: 0.2 mg/dL (ref 0.2–1.2)
Total Protein: 6.8 g/dL (ref 6.0–8.3)

## 2015-05-04 LAB — LIPID PANEL
CHOL/HDL RATIO: 3 ratio
CHOLESTEROL: 150 mg/dL (ref 0–200)
HDL: 50 mg/dL (ref >=46)
LDL Cholesterol: 79 mg/dL (ref 0–99)
Triglycerides: 105 mg/dL (ref <150)
VLDL: 21 mg/dL (ref 0–40)

## 2015-05-04 LAB — POCT URINALYSIS DIPSTICK
Bilirubin, UA: NEGATIVE
GLUCOSE UA: NEGATIVE
Ketones, UA: NEGATIVE
Leukocytes, UA: NEGATIVE
Nitrite, UA: NEGATIVE
Protein, UA: NEGATIVE
Spec Grav, UA: 1.025
Urobilinogen, UA: 0.2
pH, UA: 6.5

## 2015-05-04 NOTE — Progress Notes (Signed)
Regina Mcdonald is here for her month 105 START study visit. She denies any new problems or having any adherence issues. She hasn't seen Dr. Daiva Eves in quite awhile and knows she needs to get her [name] renewed. She is supposed to call [place] and get an appointment set up. Her next START visit is in December.

## 2015-05-06 LAB — HIV-1 RNA QUANT-NO REFLEX-BLD: HIV-1 RNA Quant, Log: <1.3 {Log} (ref <1.30)

## 2015-05-27 ENCOUNTER — Encounter: Payer: Self-pay | Admitting: Infectious Disease

## 2015-05-27 LAB — CD4/CD8 (T-HELPER/T-SUPPRESSOR CELL)
CD4%: 50.7
CD4: 761
CD8 % Suppressor T Cell: 21
CD8: 315

## 2015-12-08 ENCOUNTER — Encounter (INDEPENDENT_AMBULATORY_CARE_PROVIDER_SITE_OTHER): Payer: Self-pay | Admitting: *Deleted

## 2015-12-08 VITALS — BP 141/84 | HR 71 | Temp 97.9°F | Resp 16 | Wt 278.5 lb

## 2015-12-08 DIAGNOSIS — Z006 Encounter for examination for normal comparison and control in clinical research program: Secondary | ICD-10-CM

## 2015-12-08 NOTE — Progress Notes (Signed)
Regina Mcdonald is here today for her 61 month START visit. No new complaints verbalized. States that she is very adherent with her medications. Next study visit scheduled for July. She needs to schedule follow up appointment with Dr Daiva Eves.

## 2015-12-09 LAB — HIV-1 RNA QUANT-NO REFLEX-BLD

## 2015-12-15 ENCOUNTER — Encounter: Payer: Self-pay | Admitting: Infectious Disease

## 2015-12-15 LAB — CD4/CD8 (T-HELPER/T-SUPPRESSOR CELL)
CD4%: 48.9
CD4: 831
CD8 % Suppressor T Cell: 21.7
CD8: 369

## 2016-06-04 ENCOUNTER — Encounter (INDEPENDENT_AMBULATORY_CARE_PROVIDER_SITE_OTHER): Payer: Self-pay | Admitting: *Deleted

## 2016-06-04 VITALS — BP 137/80 | HR 64 | Temp 98.6°F | Resp 16 | Wt 278.0 lb

## 2016-06-04 DIAGNOSIS — Z006 Encounter for examination for normal comparison and control in clinical research program: Secondary | ICD-10-CM

## 2016-06-04 LAB — POCT URINALYSIS DIPSTICK
BILIRUBIN UA: NEGATIVE
GLUCOSE UA: NEGATIVE
KETONES UA: NEGATIVE
Leukocytes, UA: NEGATIVE
Nitrite, UA: NEGATIVE
PH UA: 6.5
Protein, UA: NEGATIVE
SPEC GRAV UA: 1.025
Urobilinogen, UA: 0.2

## 2016-06-04 NOTE — Progress Notes (Signed)
Regina Mcdonald is here for her month 60 visit for the START study.  She denies any new problems or medications. She says that she does notice that the Atripla makes her feel drowsy in the morning if she takes it late at night. She has not see Dr. [name] for awhile and needs to get her [name] and [name] started since she will be coming off of the Study soon. I did tell her that Dr. [name] could go ahead and switch her to something with fewer side effects. She also needs to get a PAP smear scheduled. She is due for her final study visit in December.

## 2016-06-05 LAB — COMPREHENSIVE METABOLIC PANEL
ALT: 14 U/L (ref 6–29)
AST: 21 U/L (ref 10–35)
Albumin: 3.8 g/dL (ref 3.6–5.1)
Alkaline Phosphatase: 48 U/L (ref 33–115)
BUN: 12 mg/dL (ref 7–25)
CO2: 23 mmol/L (ref 20–31)
CREATININE: 0.69 mg/dL (ref 0.50–1.10)
Calcium: 8.6 mg/dL (ref 8.6–10.2)
Chloride: 105 mmol/L (ref 98–110)
GLUCOSE: 81 mg/dL (ref 65–99)
POTASSIUM: 4 mmol/L (ref 3.5–5.3)
SODIUM: 143 mmol/L (ref 135–146)
Total Bilirubin: 0.3 mg/dL (ref 0.2–1.2)
Total Protein: 7 g/dL (ref 6.1–8.1)

## 2016-06-05 LAB — LIPID PANEL
Cholesterol: 133 mg/dL (ref 125–200)
HDL: 56 mg/dL (ref >=46)
LDL CALC: 63 mg/dL (ref <130)
Total CHOL/HDL Ratio: 2.4 Ratio (ref <=5.0)
Triglycerides: 71 mg/dL (ref <150)
VLDL: 14 mg/dL (ref <30)

## 2016-06-05 LAB — HIV-1 RNA QUANT-NO REFLEX-BLD
HIV 1 RNA Quant: <20 copies/mL (ref <20)
HIV-1 RNA Quant, Log: <1.3 Log copies/mL (ref <1.30)

## 2016-06-13 ENCOUNTER — Encounter: Payer: Self-pay | Admitting: Infectious Disease

## 2016-06-13 LAB — CD4/CD8 (T-HELPER/T-SUPPRESSOR CELL)
CD4%: 52.4
CD4: 838
CD8 % Suppressor T Cell: 21.7
CD8: 347

## 2016-06-21 ENCOUNTER — Encounter: Payer: Self-pay | Admitting: Infectious Disease

## 2016-07-16 ENCOUNTER — Ambulatory Visit: Payer: Self-pay | Admitting: Infectious Disease

## 2016-10-22 ENCOUNTER — Encounter (INDEPENDENT_AMBULATORY_CARE_PROVIDER_SITE_OTHER): Payer: Self-pay | Admitting: *Deleted

## 2016-10-22 VITALS — BP 130/82 | HR 77 | Temp 98.7°F | Wt 284.5 lb

## 2016-10-22 DIAGNOSIS — Z006 Encounter for examination for normal comparison and control in clinical research program: Secondary | ICD-10-CM

## 2016-10-22 NOTE — Progress Notes (Signed)
Regina Mcdonald is here for her final visit for START study at month 4266. She denies any new problems or concerns. She [name] before she runs out of her Atripla, which was study provided at the end of the year. An appointment has been made for her to see Dr. Daiva EvesVan Dam soon.

## 2016-10-23 LAB — CD4/CD8 (T-HELPER/T-SUPPRESSOR CELL)
CD4%: 51.3
CD4: 872
CD8 T CELL SUPPRESSOR: 20.9
CD8: 355

## 2016-10-23 LAB — HIV-1 RNA QUANT-NO REFLEX-BLD: HIV-1 RNA Quant, Log: <1.3 Log copies/mL (ref <1.30)

## 2016-11-05 ENCOUNTER — Encounter: Payer: Self-pay | Admitting: Infectious Disease

## 2016-11-26 ENCOUNTER — Ambulatory Visit: Payer: Self-pay | Admitting: Infectious Disease

## 2018-10-28 ENCOUNTER — Telehealth: Payer: Self-pay

## 2018-10-28 NOTE — Telephone Encounter (Signed)
Patient information came up on pap list. Attempted to reach out to patient to schedule pap appointment if she does not see anyone for regular pap smear. Patient is also overdue for an appointment with Dr. Daiva EvesVan Dam regarding her HIV care. Unable to reach patient at this time nor leave voice mail. Lorenso CourierJose L Valaria Kohut, [place]

## 2020-02-29 ENCOUNTER — Other Ambulatory Visit: Payer: Self-pay | Admitting: *Deleted

## 2020-02-29 DIAGNOSIS — B20 Human immunodeficiency virus [HIV] disease: Secondary | ICD-10-CM

## 2020-02-29 DIAGNOSIS — Z113 Encounter for screening for infections with a predominantly sexual mode of transmission: Secondary | ICD-10-CM

## 2020-02-29 DIAGNOSIS — Z79899 Other long term (current) drug therapy: Secondary | ICD-10-CM

## 2020-03-01 ENCOUNTER — Ambulatory Visit: Payer: BLUE CROSS/BLUE SHIELD

## 2020-03-01 ENCOUNTER — Other Ambulatory Visit: Payer: BLUE CROSS/BLUE SHIELD

## 2020-03-01 ENCOUNTER — Other Ambulatory Visit: Payer: Self-pay

## 2020-03-01 DIAGNOSIS — Z113 Encounter for screening for infections with a predominantly sexual mode of transmission: Secondary | ICD-10-CM

## 2020-03-01 DIAGNOSIS — Z79899 Other long term (current) drug therapy: Secondary | ICD-10-CM

## 2020-03-01 DIAGNOSIS — B20 Human immunodeficiency virus [HIV] disease: Secondary | ICD-10-CM

## 2020-03-02 LAB — T-HELPER CELL (CD4) - (RCID CLINIC ONLY)
CD4 % Helper T Cell: 45 % (ref 33–65)
CD4 T Cell Abs: 750 /uL (ref 400–1790)

## 2020-03-03 LAB — URINE CYTOLOGY ANCILLARY ONLY
Chlamydia: NEGATIVE
Comment: NEGATIVE
Comment: NORMAL
Neisseria Gonorrhea: NEGATIVE

## 2020-03-04 LAB — CBC WITH DIFFERENTIAL/PLATELET
Absolute Monocytes: 428 cells/uL (ref 200–950)
Basophils Absolute: 7 cells/uL (ref 0–200)
Basophils Relative: 0.1 %
Eosinophils Absolute: 97 cells/uL (ref 15–500)
Eosinophils Relative: 1.4 %
HCT: 31.2 % — ABNORMAL LOW (ref 35.0–45.0)
Hemoglobin: 9.6 g/dL — ABNORMAL LOW (ref 11.7–15.5)
Lymphs Abs: 1622 cells/uL (ref 850–3900)
MCH: 26 pg — ABNORMAL LOW (ref 27.0–33.0)
MCHC: 30.8 g/dL — ABNORMAL LOW (ref 32.0–36.0)
MCV: 84.6 fL (ref 80.0–100.0)
MPV: 10.1 fL (ref 7.5–12.5)
Monocytes Relative: 6.2 %
Neutro Abs: 4747 cells/uL (ref 1500–7800)
Neutrophils Relative %: 68.8 %
Platelets: 327 10*3/uL (ref 140–400)
RBC: 3.69 10*6/uL — ABNORMAL LOW (ref 3.80–5.10)
RDW: 14.5 % (ref 11.0–15.0)
Total Lymphocyte: 23.5 %
WBC: 6.9 10*3/uL (ref 3.8–10.8)

## 2020-03-04 LAB — LIPID PANEL
Cholesterol: 145 mg/dL (ref <200)
HDL: 46 mg/dL — ABNORMAL LOW (ref >=50)
LDL Cholesterol (Calc): 79 mg/dL (calc)
Non-HDL Cholesterol (Calc): 99 mg/dL (calc) (ref <130)
Total CHOL/HDL Ratio: 3.2 (calc) (ref <5.0)
Triglycerides: 115 mg/dL (ref <150)

## 2020-03-04 LAB — COMPLETE METABOLIC PANEL WITH GFR
AG Ratio: 1.3 (calc) (ref 1.0–2.5)
ALT: 12 U/L (ref 6–29)
AST: 19 U/L (ref 10–35)
Albumin: 4.1 g/dL (ref 3.6–5.1)
Alkaline phosphatase (APISO): 49 U/L (ref 31–125)
BUN: 15 mg/dL (ref 7–25)
CO2: 28 mmol/L (ref 20–32)
Calcium: 9.3 mg/dL (ref 8.6–10.2)
Chloride: 105 mmol/L (ref 98–110)
Creat: 0.76 mg/dL (ref 0.50–1.10)
GFR, Est African American: 107 mL/min/{1.73_m2} (ref >=60)
GFR, Est Non African American: 92 mL/min/{1.73_m2} (ref >=60)
Globulin: 3.2 g/dL (calc) (ref 1.9–3.7)
Glucose, Bld: 93 mg/dL (ref 65–99)
Potassium: 4 mmol/L (ref 3.5–5.3)
Sodium: 142 mmol/L (ref 135–146)
Total Bilirubin: 0.2 mg/dL (ref 0.2–1.2)
Total Protein: 7.3 g/dL (ref 6.1–8.1)

## 2020-03-04 LAB — HIV-1 RNA QUANT-NO REFLEX-BLD
HIV 1 RNA Quant: 59 copies/mL — ABNORMAL HIGH
HIV-1 RNA Quant, Log: 1.77 Log copies/mL — ABNORMAL HIGH

## 2020-03-04 LAB — RPR: RPR Ser Ql: NONREACTIVE

## 2020-04-06 ENCOUNTER — Other Ambulatory Visit: Payer: Self-pay

## 2020-04-06 ENCOUNTER — Encounter: Payer: BLUE CROSS/BLUE SHIELD | Admitting: Infectious Disease

## 2020-05-16 ENCOUNTER — Other Ambulatory Visit: Payer: Self-pay

## 2020-05-16 ENCOUNTER — Ambulatory Visit (INDEPENDENT_AMBULATORY_CARE_PROVIDER_SITE_OTHER): Payer: Self-pay | Admitting: Infectious Disease

## 2020-05-16 ENCOUNTER — Encounter: Payer: Self-pay | Admitting: Infectious Disease

## 2020-05-16 ENCOUNTER — Other Ambulatory Visit (HOSPITAL_COMMUNITY)
Admission: RE | Admit: 2020-05-16 | Discharge: 2020-05-16 | Disposition: A | Discharge status: Home / Self Care | Payer: BC Managed Care – PPO | Source: Ambulatory Visit | Attending: Infectious Disease | Admitting: Infectious Disease

## 2020-05-16 VITALS — BP 143/77 | HR 76 | Temp 98.4°F | Wt 300.0 lb

## 2020-05-16 DIAGNOSIS — B20 Human immunodeficiency virus [HIV] disease: Secondary | ICD-10-CM

## 2020-05-16 DIAGNOSIS — E669 Obesity, unspecified: Secondary | ICD-10-CM

## 2020-05-16 DIAGNOSIS — F32A Depression, unspecified: Secondary | ICD-10-CM | POA: Insufficient documentation

## 2020-05-16 DIAGNOSIS — Z23 Encounter for immunization: Secondary | ICD-10-CM

## 2020-05-16 DIAGNOSIS — F331 Major depressive disorder, recurrent, moderate: Secondary | ICD-10-CM

## 2020-05-16 HISTORY — DX: Obesity, unspecified: E66.9

## 2020-05-16 HISTORY — DX: Depression, unspecified: F32.A

## 2020-05-16 MED ORDER — DOVATO 50-300 MG PO TABS: 1.0000 | ORAL_TABLET | Freq: Every day | ORAL | 5 refills | Status: DC

## 2020-05-16 NOTE — Progress Notes (Signed)
Subjective:  Chief complaint: re-establish for HIV care   Patient ID: Regina Mcdonald, female    DOB: 02-08-1971, 49 y.o.   MRN: [id number]  HPI  49 year old African American woman with morbid obesity and HIV who has appeared to be a long term non-progressor. We had  Her previously on Atripla via START study but she has been  Out of care from research since 2017 and not seen by me since 2014.  She tells me that she has been off meds for a year. Apparently she had stockpiled Atripla enough to get through many many years. Her VL was still only.  Lab Results  Component Value Date   HIV1RNAQUANT 43 (H) 03/01/2020      Past Medical History:  Diagnosis Date  . Obesity (BMI 30-39.9) 05/16/2020    No past surgical history on file.  No family history on file.    Social History   Socioeconomic History  . Marital status: Widowed    Spouse name: Not on file  . Number of children: Not on file  . Years of education: Not on file  . Highest education level: Not on file  Occupational History  . Not on file  Tobacco Use  . Smoking status: Never Smoker  . Smokeless tobacco: Never Used  Substance and Sexual Activity  . Alcohol use: No  . Drug use: No  . Sexual activity: Not Currently  Other Topics Concern  . Not on file  Social History Narrative  . Not on file   Social Determinants of Health   Financial Resource Strain:   . Difficulty of Paying Living Expenses:   Food Insecurity:   . Worried About Charity fundraiser in the Last Year:   . Arboriculturist in the Last Year:   Transportation Needs:   . Film/video editor (Medical):   Marland Kitchen Lack of Transportation (Non-Medical):   Physical Activity:   . Days of Exercise per Week:   . Minutes of Exercise per Session:   Stress:   . Feeling of Stress :   Social Connections:   . Frequency of Communication with Friends and Family:   . Frequency of Social Gatherings with Friends and Family:   . Attends Religious Services:   .  Active Member of Clubs or Organizations:   . Attends Archivist Meetings:   Marland Kitchen Marital Status:     No Known Allergies   Current Outpatient Medications:  .  albuterol (PROVENTIL) 90 MCG/ACT inhaler, Inhale 1 puff into the lungs every 6 (six) hours as needed.   (Patient not taking: Reported on 05/16/2020), Disp: , Rfl:  .  Dolutegravir-lamiVUDine (DOVATO) 50-300 MG TABS, Take 1 tablet by mouth daily., Disp: 30 tablet, Rfl: 5   Review of Systems  Constitutional: Negative for chills and fever.  HENT: Negative for congestion and sore throat.   Eyes: Negative for photophobia.  Respiratory: Negative for cough, shortness of breath and wheezing.   Cardiovascular: Negative for chest pain, palpitations and leg swelling.  Gastrointestinal: Negative for abdominal pain, blood in stool, constipation, diarrhea, nausea and vomiting.  Genitourinary: Negative for dysuria, flank pain and hematuria.  Musculoskeletal: Negative for back pain and myalgias.  Skin: Negative for rash.  Neurological: Negative for dizziness, weakness and headaches.  Hematological: Does not bruise/bleed easily.  Psychiatric/Behavioral: Positive for dysphoric mood. Negative for suicidal ideas.       Objective:   Physical Exam Constitutional:      General: She  is not in acute distress.    Appearance: She is not diaphoretic.  HENT:     Head: Normocephalic and atraumatic.     Right Ear: External ear normal.     Left Ear: External ear normal.     Nose: Nose normal.     Mouth/Throat:     Pharynx: No oropharyngeal exudate.  Eyes:     General: No scleral icterus.    Conjunctiva/sclera: Conjunctivae normal.     Pupils: Pupils are equal, round, and reactive to light.  Cardiovascular:     Rate and Rhythm: Normal rate and regular rhythm.     Heart sounds: Normal heart sounds. No murmur heard.  No friction rub. No gallop.   Pulmonary:     Effort: Pulmonary effort is normal. No respiratory distress.     Breath sounds:  Normal breath sounds. No wheezing or rales.  Abdominal:     General: Bowel sounds are normal. There is no distension.     Palpations: Abdomen is soft.     Tenderness: There is no abdominal tenderness. There is no rebound.  Musculoskeletal:        General: No tenderness. Normal range of motion.     Cervical back: Normal range of motion and neck supple.  Lymphadenopathy:     Cervical: No cervical adenopathy.  Skin:    General: Skin is warm and dry.     Coloration: Skin is not pale.     Findings: No erythema or rash.  Neurological:     General: No focal deficit present.     Mental Status: She is alert and oriented to person, place, and time.     Coordination: Coordination normal.  Psychiatric:        Attention and Perception: Attention and perception normal.        Mood and Affect: Mood is depressed.        Speech: Speech normal.        Behavior: Behavior normal. Behavior is cooperative.        Cognition and Memory: Cognition and memory normal.        Judgment: Judgment normal.           Assessment & Plan:  HIV disease: as a long term non progressor, she will likely continue to be able to be  Off meds and not suffer the consequences of uncontrolled HIV though there I s data from aCTG and other trials that such individuals may have heightened immune activation and inflammation which ARV may bluint  I am going to start her on Dovato but check HBV DNA given she had isolated core Hep B ab though sag negative  She is interested in [place]  Depressive ssx around HIV + diagnosis (which she acquired from her former husband (who is no deceased).   Offered supportive counseling and recommended [facility]  Vaccinate w prevnar and meningoccal vaccine  Obesity: she needs to carb restrict to lose weight. I cautioned re the risk of weight gain w INSTI

## 2020-05-17 LAB — T-HELPER CELL (CD4) - (RCID CLINIC ONLY)
CD4 % Helper T Cell: 42 % (ref 33–65)
CD4 T Cell Abs: 799 /uL (ref 400–1790)

## 2020-05-18 LAB — CBC WITH DIFFERENTIAL/PLATELET
Absolute Monocytes: 560 cells/uL (ref 200–950)
Basophils Absolute: 21 cells/uL (ref 0–200)
Basophils Relative: 0.3 %
Eosinophils Absolute: 42 cells/uL (ref 15–500)
Eosinophils Relative: 0.6 %
HCT: 36.7 % (ref 35.0–45.0)
Hemoglobin: 11.6 g/dL — ABNORMAL LOW (ref 11.7–15.5)
Lymphs Abs: 1904 cells/uL (ref 850–3900)
MCH: 27 pg (ref 27.0–33.0)
MCHC: 31.6 g/dL — ABNORMAL LOW (ref 32.0–36.0)
MCV: 85.3 fL (ref 80.0–100.0)
MPV: 10.7 fL (ref 7.5–12.5)
Monocytes Relative: 8 %
Neutro Abs: 4473 cells/uL (ref 1500–7800)
Neutrophils Relative %: 63.9 %
Platelets: 319 10*3/uL (ref 140–400)
RBC: 4.3 10*6/uL (ref 3.80–5.10)
RDW: 15.6 % — ABNORMAL HIGH (ref 11.0–15.0)
Total Lymphocyte: 27.2 %
WBC: 7 10*3/uL (ref 3.8–10.8)

## 2020-05-18 LAB — COMPLETE METABOLIC PANEL WITH GFR
AG Ratio: 1.3 (calc) (ref 1.0–2.5)
ALT: 15 U/L (ref 6–29)
AST: 19 U/L (ref 10–35)
Albumin: 4.4 g/dL (ref 3.6–5.1)
Alkaline phosphatase (APISO): 41 U/L (ref 31–125)
BUN: 13 mg/dL (ref 7–25)
CO2: 27 mmol/L (ref 20–32)
Calcium: 9.5 mg/dL (ref 8.6–10.2)
Chloride: 106 mmol/L (ref 98–110)
Creat: 0.72 mg/dL (ref 0.50–1.10)
GFR, Est African American: 114 mL/min/{1.73_m2} (ref >=60)
GFR, Est Non African American: 98 mL/min/{1.73_m2} (ref >=60)
Globulin: 3.3 g/dL (calc) (ref 1.9–3.7)
Glucose, Bld: 91 mg/dL (ref 65–99)
Potassium: 3.9 mmol/L (ref 3.5–5.3)
Sodium: 142 mmol/L (ref 135–146)
Total Bilirubin: 0.3 mg/dL (ref 0.2–1.2)
Total Protein: 7.7 g/dL (ref 6.1–8.1)

## 2020-05-18 LAB — HIV-1 RNA QUANT-NO REFLEX-BLD
HIV 1 RNA Quant: 104 copies/mL — ABNORMAL HIGH
HIV-1 RNA Quant, Log: 2.02 Log copies/mL — ABNORMAL HIGH

## 2020-05-18 LAB — HEPATITIS B DNA, ULTRAQUANTITATIVE, PCR
Hepatitis B DNA (Calc): <1 Log IU/mL — ABNORMAL HIGH
Hepatitis B DNA: <10 IU/mL — ABNORMAL HIGH

## 2020-05-18 LAB — URINE CYTOLOGY ANCILLARY ONLY
Chlamydia: NEGATIVE
Comment: NEGATIVE
Comment: NORMAL
Neisseria Gonorrhea: NEGATIVE

## 2020-05-18 LAB — RPR: RPR Ser Ql: NONREACTIVE

## 2020-06-14 ENCOUNTER — Telehealth: Payer: Self-pay

## 2020-06-14 NOTE — Telephone Encounter (Signed)
Received faxed PA for Dovato from [facility]. Initiated PA with Eve, Cma through covermymeds,  PA currently pending. Will fax labs and office note. Lorenso Courier, [place]

## 2020-06-15 NOTE — Telephone Encounter (Signed)
Additional information requested by [facility]. Faxed request form with requested additional information.   Dasja Brase Loyola Mast, RN

## 2020-06-20 ENCOUNTER — Other Ambulatory Visit: Payer: Self-pay | Admitting: Pharmacist

## 2020-06-20 DIAGNOSIS — B20 Human immunodeficiency virus [HIV] disease: Secondary | ICD-10-CM

## 2020-06-20 MED ORDER — BIKTARVY 50-200-25 MG PO TABS: 1.0000 | ORAL_TABLET | Freq: Every day | ORAL | 5 refills | Status: DC

## 2020-06-20 NOTE — Telephone Encounter (Signed)
Thx Cassie !

## 2020-06-20 NOTE — Progress Notes (Signed)
Dovato not on patient's formulary. Will send in Biktarvy to see if approved.

## 2020-06-20 NOTE — Telephone Encounter (Signed)
PA for Dovato denied.  Patient does not meet criteria for medication; will need to send in preferred alternative medication. Alternatives; Complera oral tablet, Biktarvy, Genvoya, Stribild.  Will forward message to provider. Lorenso Courier, [place]

## 2020-06-20 NOTE — Telephone Encounter (Signed)
Sent in [employer] to [employer]. Hopefully that will be approved.

## 2020-06-21 ENCOUNTER — Other Ambulatory Visit: Payer: Self-pay

## 2020-06-21 ENCOUNTER — Ambulatory Visit: Payer: 59

## 2020-06-23 ENCOUNTER — Encounter: Payer: Self-pay | Admitting: Infectious Disease

## 2020-06-23 ENCOUNTER — Ambulatory Visit (INDEPENDENT_AMBULATORY_CARE_PROVIDER_SITE_OTHER): Payer: 59 | Admitting: Family Medicine

## 2020-06-23 ENCOUNTER — Other Ambulatory Visit (HOSPITAL_COMMUNITY)
Admission: RE | Admit: 2020-06-23 | Discharge: 2020-06-23 | Disposition: A | Discharge status: Home / Self Care | Payer: 59 | Source: Ambulatory Visit | Attending: Family Medicine | Admitting: Family Medicine

## 2020-06-23 ENCOUNTER — Other Ambulatory Visit: Payer: Self-pay

## 2020-06-23 ENCOUNTER — Encounter: Payer: Self-pay | Admitting: Family Medicine

## 2020-06-23 VITALS — BP 153/71 | HR 72 | Ht 61.5 in | Wt 290.0 lb

## 2020-06-23 DIAGNOSIS — T8332XA Displacement of intrauterine contraceptive device, initial encounter: Secondary | ICD-10-CM | POA: Diagnosis not present

## 2020-06-23 DIAGNOSIS — Z01419 Encounter for gynecological examination (general) (routine) without abnormal findings: Secondary | ICD-10-CM | POA: Insufficient documentation

## 2020-06-23 DIAGNOSIS — N951 Menopausal and female climacteric states: Secondary | ICD-10-CM | POA: Diagnosis not present

## 2020-06-23 DIAGNOSIS — Z6841 Body Mass Index (BMI) 40.0 and over, adult: Secondary | ICD-10-CM

## 2020-06-23 NOTE — Progress Notes (Signed)
GYNECOLOGY ANNUAL PREVENTATIVE CARE ENCOUNTER NOTE  Subjective:   Regina Mcdonald is a 49 y.o. G4P4000 female here for a routine annual gynecologic exam.  Current complaints: skipping periods.   Denies abnormal vaginal bleeding, discharge, pelvic pain, problems with intercourse or other gynecologic concerns.    Gynecologic History No LMP recorded. (Menstrual status: IUD). Contraception: IUD - expired 1-2 years ago Last Pap: 2013. Results were: normal Last mammogram: n/a.  Obstetric History OB History  Gravida Para Term Preterm AB Living  4 4 4         SAB TAB Ectopic Multiple Live Births        1 4    # Outcome Date GA Lbr Len/2nd Weight Sex Delivery Anes PTL Lv  4 Term      Vag-Spont     3 Term      Vag-Spont     2 Term      Vag-Spont     1 Term      Vag-Spont       Past Medical History:  Diagnosis Date  . Depression 05/16/2020  . Obesity (BMI 30-39.9) 05/16/2020    History reviewed. No pertinent surgical history.  Current Outpatient Medications on File Prior to Visit  Medication Sig Dispense Refill  . bictegravir-emtricitabine-tenofovir AF (BIKTARVY) 50-200-25 MG TABS tablet Take 1 tablet by mouth daily. 30 tablet 5  . albuterol (PROVENTIL) 90 MCG/ACT inhaler Inhale 1 puff into the lungs every 6 (six) hours as needed.   (Patient not taking: Reported on 05/16/2020)     No current facility-administered medications on file prior to visit.    No Known Allergies  Social History   Socioeconomic History  . Marital status: Widowed    Spouse name: Not on file  . Number of children: Not on file  . Years of education: Not on file  . Highest education level: Not on file  Occupational History  . Not on file  Tobacco Use  . Smoking status: Never Smoker  . Smokeless tobacco: Never Used  Vaping Use  . Vaping Use: Never used  Substance and Sexual Activity  . Alcohol use: No  . Drug use: No  . Sexual activity: Not Currently  Other Topics Concern  . Not on file  Social  History Narrative  . Not on file   Social Determinants of Health   Financial Resource Strain:   . Difficulty of Paying Living Expenses:   Food Insecurity:   . Worried About 05/18/2020 in the Last Year:   . Programme researcher, broadcasting/film/video in the Last Year:   Transportation Needs:   . Barista (Medical):   Freight forwarder Lack of Transportation (Non-Medical):   Physical Activity:   . Days of Exercise per Week:   . Minutes of Exercise per Session:   Stress:   . Feeling of Stress :   Social Connections:   . Frequency of Communication with Friends and Family:   . Frequency of Social Gatherings with Friends and Family:   . Attends Religious Services:   . Active Member of Clubs or Organizations:   . Attends Marland Kitchen Meetings:   Banker Marital Status:   Intimate Partner Violence:   . Fear of Current or Ex-Partner:   . Emotionally Abused:   Marland Kitchen Physically Abused:   . Sexually Abused:     Family History  Problem Relation Age of Onset  . Cancer Neg Hx   . Hypertension Neg Hx   .  Diabetes Neg Hx     The following portions of the patient's history were reviewed and updated as appropriate: allergies, current medications, past family history, past medical history, past social history, past surgical history and problem list.  Review of Systems Pertinent items are noted in HPI.   Objective:  BP (!) 153/71   Pulse 72   Ht 5' 1.5" (1.562 m)   Wt 290 lb (131.5 kg)   BMI 53.91 kg/m  Wt Readings from Last 3 Encounters:  06/23/20 290 lb (131.5 kg)  05/16/20 300 lb (136.1 kg)  10/22/16 284 lb 8 oz (129 kg)     Chaperone present during exam  CONSTITUTIONAL: Well-developed, well-nourished female in no acute distress.  HENT:  Normocephalic, atraumatic, External right and left ear normal. Oropharynx is clear and moist EYES: Conjunctivae and EOM are normal. Pupils are equal, round, and reactive to light. No scleral icterus.  NECK: Normal range of motion, supple, no masses.  Normal  thyroid.   CARDIOVASCULAR: Normal heart rate noted, regular rhythm RESPIRATORY: Clear to auscultation bilaterally. Effort and breath sounds normal, no problems with respiration noted. BREASTS: Symmetric in size. No masses, skin changes, nipple drainage, or lymphadenopathy. ABDOMEN: Soft, normal bowel sounds, no distention noted.  No tenderness, rebound or guarding.  PELVIC: Normal appearing external genitalia; normal appearing vaginal mucosa and cervix.  No abnormal discharge noted.  IUD not seen MUSCULOSKELETAL: Normal range of motion. No tenderness.  No cyanosis, clubbing, or edema.  2+ distal pulses. SKIN: Skin is warm and dry. No rash noted. Not diaphoretic. No erythema. No pallor. NEUROLOGIC: Alert and oriented to person, place, and time. Normal reflexes, muscle tone coordination. No cranial nerve deficit noted. PSYCHIATRIC: Normal mood and affect. Normal behavior. Normal judgment and thought content.  Assessment:  Annual gynecologic examination with pap smear   Plan:  1. Well Woman Exam Will follow up results of pap smear and manage accordingly. Mammogram scheduled - Cytology - PAP( [place]) - MM Digital Screening; Future  2. Intrauterine contraceptive device threads lost, initial encounter - US PELVIS TRANSVAGINAL NON-OB (TV ONLY); Future  3. Perimenopause  4. BMI 50.0-59.9, adult (HCC)    Routine preventative health maintenance measures emphasized. Please refer to After Visit Summary for other counseling recommendations.    Candelaria Celeste, DO [facility]

## 2020-06-28 LAB — CYTOLOGY - PAP
Adequacy: ABSENT
Comment: NEGATIVE
Diagnosis: NEGATIVE
High risk HPV: NEGATIVE

## 2020-07-01 ENCOUNTER — Ambulatory Visit (INDEPENDENT_AMBULATORY_CARE_PROVIDER_SITE_OTHER): Payer: 59

## 2020-07-01 ENCOUNTER — Ambulatory Visit (HOSPITAL_BASED_OUTPATIENT_CLINIC_OR_DEPARTMENT_OTHER): Admission: RE | Admit: 2020-07-01 | Payer: 59 | Source: Ambulatory Visit

## 2020-07-01 ENCOUNTER — Other Ambulatory Visit: Payer: Self-pay

## 2020-07-01 ENCOUNTER — Ambulatory Visit (HOSPITAL_BASED_OUTPATIENT_CLINIC_OR_DEPARTMENT_OTHER): Payer: 59

## 2020-07-01 DIAGNOSIS — Z01419 Encounter for gynecological examination (general) (routine) without abnormal findings: Secondary | ICD-10-CM | POA: Diagnosis not present

## 2020-07-01 DIAGNOSIS — T8332XA Displacement of intrauterine contraceptive device, initial encounter: Secondary | ICD-10-CM

## 2020-07-04 ENCOUNTER — Telehealth: Payer: Self-pay | Admitting: Infectious Disease

## 2020-07-04 NOTE — Telephone Encounter (Signed)
error 

## 2020-07-08 ENCOUNTER — Ambulatory Visit: Payer: 59

## 2020-07-11 DIAGNOSIS — L989 Disorder of the skin and subcutaneous tissue, unspecified: Secondary | ICD-10-CM | POA: Insufficient documentation

## 2020-07-12 DIAGNOSIS — R7303 Prediabetes: Secondary | ICD-10-CM | POA: Insufficient documentation

## 2020-07-18 ENCOUNTER — Other Ambulatory Visit: Payer: Self-pay

## 2020-07-18 ENCOUNTER — Ambulatory Visit (INDEPENDENT_AMBULATORY_CARE_PROVIDER_SITE_OTHER): Payer: 59 | Admitting: Infectious Disease

## 2020-07-18 ENCOUNTER — Encounter: Payer: Self-pay | Admitting: Infectious Disease

## 2020-07-18 VITALS — BP 169/81 | HR 71 | Temp 98.9°F | Wt 281.0 lb

## 2020-07-18 DIAGNOSIS — R4589 Other symptoms and signs involving emotional state: Secondary | ICD-10-CM | POA: Diagnosis not present

## 2020-07-18 DIAGNOSIS — R030 Elevated blood-pressure reading, without diagnosis of hypertension: Secondary | ICD-10-CM

## 2020-07-18 DIAGNOSIS — B20 Human immunodeficiency virus [HIV] disease: Secondary | ICD-10-CM | POA: Diagnosis not present

## 2020-07-18 DIAGNOSIS — F331 Major depressive disorder, recurrent, moderate: Secondary | ICD-10-CM | POA: Diagnosis not present

## 2020-07-18 HISTORY — DX: Other symptoms and signs involving emotional state: R45.89

## 2020-07-18 MED ORDER — BIKTARVY 50-200-25 MG PO TABS: 1.0000 | ORAL_TABLET | Freq: Every day | ORAL | 5 refills | Status: DC

## 2020-07-18 NOTE — Progress Notes (Signed)
Subjective:  Chief complaint: re-establish for HIV care   Patient ID: Regina Mcdonald, female    DOB: 05-05-1971, 49 y.o.   MRN: [id number]  HPI  49 year old African American woman with morbid obesity and HIV who has appeared to be a long term non-progressor. We had  Her previously on Atripla via START study but she has been  Out of care from research since 2017 and not seen by me since 2014.  She tells me that she has been off meds for a year. Apparently she had stockpiled Atripla enough to get through many many years. Her VL was still only.  Lab Results  Component Value Date   HIV1RNAQUANT 104 (H) 05/16/2020    I tried to start Dovato but her insurance would not cover it.  Therefore she was switched to [employer].  He has felt a little bit more sensitive than usual and wonders if this is due to the Power.  She is also been worried about weight gain on the Biktarvy as she is trying to lose weight.    Past Medical History:  Diagnosis Date  . Depression 05/16/2020  . Obesity (BMI 30-39.9) 05/16/2020    No past surgical history on file.  Family History  Problem Relation Age of Onset  . Cancer Neg Hx   . Hypertension Neg Hx   . Diabetes Neg Hx       Social History   Socioeconomic History  . Marital status: Widowed    Spouse name: Not on file  . Number of children: Not on file  . Years of education: Not on file  . Highest education level: Not on file  Occupational History  . Not on file  Tobacco Use  . Smoking status: Never Smoker  . Smokeless tobacco: Never Used  Vaping Use  . Vaping Use: Never used  Substance and Sexual Activity  . Alcohol use: No  . Drug use: No  . Sexual activity: Not Currently  Other Topics Concern  . Not on file  Social History Narrative  . Not on file   Social Determinants of Health   Financial Resource Strain:   . Difficulty of Paying Living Expenses: Not on file  Food Insecurity:   . Worried About Programme researcher, broadcasting/film/video in the Last  Year: Not on file  . Ran Out of Food in the Last Year: Not on file  Transportation Needs:   . Lack of Transportation (Medical): Not on file  . Lack of Transportation (Non-Medical): Not on file  Physical Activity:   . Days of Exercise per Week: Not on file  . Minutes of Exercise per Session: Not on file  Stress:   . Feeling of Stress : Not on file  Social Connections:   . Frequency of Communication with Friends and Family: Not on file  . Frequency of Social Gatherings with Friends and Family: Not on file  . Attends Religious Services: Not on file  . Active Member of Clubs or Organizations: Not on file  . Attends Banker Meetings: Not on file  . Marital Status: Not on file    No Known Allergies   Current Outpatient Medications:  .  albuterol (PROVENTIL) 90 MCG/ACT inhaler, Inhale 1 puff into the lungs every 6 (six) hours as needed.   (Patient not taking: Reported on 05/16/2020), Disp: , Rfl:  .  bictegravir-emtricitabine-tenofovir AF (BIKTARVY) 50-200-25 MG TABS tablet, Take 1 tablet by mouth daily., Disp: 30 tablet, Rfl: 5  Review of Systems  Constitutional: Negative for chills and fever.  HENT: Negative for congestion and sore throat.   Eyes: Negative for photophobia.  Respiratory: Negative for cough, shortness of breath and wheezing.   Cardiovascular: Negative for chest pain, palpitations and leg swelling.  Gastrointestinal: Negative for abdominal pain, blood in stool, constipation, diarrhea, nausea and vomiting.  Genitourinary: Negative for dysuria, flank pain and hematuria.  Musculoskeletal: Negative for back pain and myalgias.  Skin: Negative for rash.  Neurological: Negative for dizziness, weakness and headaches.  Hematological: Does not bruise/bleed easily.  Psychiatric/Behavioral: Positive for dysphoric mood. Negative for hallucinations and suicidal ideas. The patient is not nervous/anxious.        Objective:   Physical Exam Constitutional:       General: She is not in acute distress.    Appearance: She is not diaphoretic.  HENT:     Head: Normocephalic and atraumatic.     Right Ear: External ear normal.     Left Ear: External ear normal.     Nose: Nose normal.     Mouth/Throat:     Pharynx: No oropharyngeal exudate.  Eyes:     General: No scleral icterus.    Conjunctiva/sclera: Conjunctivae normal.     Pupils: Pupils are equal, round, and reactive to light.  Cardiovascular:     Rate and Rhythm: Normal rate and regular rhythm.     Heart sounds: Normal heart sounds. No murmur heard.  No friction rub. No gallop.   Pulmonary:     Effort: Pulmonary effort is normal. No respiratory distress.     Breath sounds: Normal breath sounds. No wheezing or rales.  Abdominal:     General: Bowel sounds are normal. There is no distension.     Palpations: Abdomen is soft.     Tenderness: There is no abdominal tenderness. There is no rebound.  Musculoskeletal:        General: No tenderness. Normal range of motion.     Cervical back: Normal range of motion and neck supple.  Lymphadenopathy:     Cervical: No cervical adenopathy.  Skin:    General: Skin is warm and dry.     Coloration: Skin is not pale.     Findings: No erythema or rash.  Neurological:     General: No focal deficit present.     Mental Status: She is alert and oriented to person, place, and time.     Coordination: Coordination normal.  Psychiatric:        Attention and Perception: Attention and perception normal.        Mood and Affect: Mood normal. Mood is not depressed.        Speech: Speech normal.        Behavior: Behavior normal. Behavior is cooperative.        Cognition and Memory: Cognition and memory normal.        Judgment: Judgment normal.           Assessment & Plan:  HIV disease: as a long term non progressor, she will likely continue to be able to be  Off meds and not suffer the consequences of uncontrolled HIV though there I s data from aCTG and  other trials that such individuals may have heightened immune activation and inflammation which ARV may bluint  We try to start Dovato but insurance did not cover it was switched to [employer].  She does have some moodiness that could be due to the integrase strand transfer inhibitor.  We discussed potential change to [facility] with food and and avoidance of PPI and acids Delstrigo is another possibility  Depressive ssx around HIV + diagnosis (which she acquired from her former husband (who is no deceased).   Had recommended [facility]  Vaccinate w prevnar and meningoccal vaccine  Obesity: she needs to carb restrict to lose weight. I cautioned re the risk of weight gain w INSTI

## 2020-07-21 LAB — HIV-1 RNA QUANT-NO REFLEX-BLD
HIV 1 RNA Quant: <20 Copies/mL
HIV-1 RNA Quant, Log: <1.3 Log cps/mL

## 2020-07-29 ENCOUNTER — Encounter: Payer: Self-pay | Admitting: *Deleted

## 2020-07-29 ENCOUNTER — Other Ambulatory Visit: Payer: Self-pay

## 2020-07-29 ENCOUNTER — Ambulatory Visit (INDEPENDENT_AMBULATORY_CARE_PROVIDER_SITE_OTHER): Payer: 59 | Admitting: Obstetrics and Gynecology

## 2020-07-29 ENCOUNTER — Encounter: Payer: Self-pay | Admitting: Obstetrics and Gynecology

## 2020-07-29 VITALS — BP 145/75 | HR 75 | Ht 62.0 in | Wt 277.5 lb

## 2020-07-29 DIAGNOSIS — Z30433 Encounter for removal and reinsertion of intrauterine contraceptive device: Secondary | ICD-10-CM | POA: Diagnosis not present

## 2020-07-29 MED ORDER — LEVONORGESTREL 19.5 MCG/DAY IU IUD: INTRAUTERINE_SYSTEM | Freq: Once | INTRAUTERINE | Status: AC

## 2020-07-29 NOTE — Progress Notes (Signed)
     IUD REMOVAL & INSERTION PROCEDURE NOTE  Regina Mcdonald is a 49 y.o. G4P4000 here for Liletta removal and insertion. No GYN concerns.   She was counseled regarding the risks/benefits of IUD including insertion risk of infection, hemorrhage, damage to surrounding tissue and organs, uterine perforation. She was counseled regarding risks of IUD including implantation into uterine wall, malpositioning, misplacement out of the uterus, migration outside of uterus, possible need for hysteroscopic or laparoscopic removal, ovarian cysts, expulsion. She was advised that risk of pregnancy is low with negative UPT but is not zero and IUD insertion may cause miscarriage. Reviewed that she is also at slightly higher risk for ectopic pregnancy and she should take a pregnancy test if she believes she may be pregnant. She was advised to use backup method of protection for one week. She verbalized understanding of all of the above and consent signed.   Last pap smear was on 06/23/2020 and was normal  IUD Removal and Insertion  Patient identified and an adequate time out was performed. Patient with normal appearing external female genitalia. Graves speculum placed in vagina and Liletta IUD strings easily visualized. Strings grasped with ring forceps and removed easily. Minimal bleeding noted.   The cervix was cleaned with Betadine x 2 and grasped anteriorly with a single tooth tenaculum.  A uterine sound was used to sound the uterus to 7 cm;  the IUD was then placed per manufacturer's recommendations. Strings trimmed to 3 cm. Tenaculum was removed, good hemostasis noted. Patient tolerated procedure well.   Patient was given post-procedure instructions. Patient was also asked to check IUD strings periodically and follow up in 4 weeks for IUD check.  Liletta IUD Exp: 11/2019 Lot: [id number]  K. Therese Sarah, M.D. Attending [facility])

## 2020-09-05 ENCOUNTER — Ambulatory Visit (INDEPENDENT_AMBULATORY_CARE_PROVIDER_SITE_OTHER): Payer: 59 | Admitting: Obstetrics and Gynecology

## 2020-09-05 ENCOUNTER — Encounter: Payer: Self-pay | Admitting: Obstetrics and Gynecology

## 2020-09-05 ENCOUNTER — Other Ambulatory Visit: Payer: Self-pay

## 2020-09-05 VITALS — BP 152/80 | HR 79 | Ht 62.0 in | Wt 276.0 lb

## 2020-09-05 DIAGNOSIS — Z30431 Encounter for routine checking of intrauterine contraceptive device: Secondary | ICD-10-CM

## 2020-09-05 NOTE — Progress Notes (Signed)
     GYNECOLOGY OFFICE PROGRESS NOTE  History:  49 y.o. G4P4000 here today for today for IUD string check; Liletta IUD was placed 07/29/20. No complaints about the IUD, no concerning side effects.  The following portions of the patient's history were reviewed and updated as appropriate: allergies, current medications, past family history, past medical history, past social history, past surgical history and problem list. Last pap smear on 06/23/20 was normal, negative HRHPV.  Review of Systems:  Pertinent items are noted in HPI.   Objective:  Physical Exam Blood pressure (!) 152/80, pulse 79, height 5\' 2"  (1.575 m), weight 276 lb (125.2 kg). CONSTITUTIONAL: Well-developed, well-nourished female in no acute distress.  HENT:  Normocephalic, atraumatic. External right and left ear normal. Oropharynx is clear and moist EYES: Conjunctivae and EOM are normal. Pupils are equal, round, and reactive to light. No scleral icterus.  NECK: Normal range of motion, supple, no masses CARDIOVASCULAR: Normal heart rate noted RESPIRATORY: Effort and breath sounds normal, no problems with respiration noted ABDOMEN: Soft, no distention noted.   PELVIC: Normal appearing external genitalia; normal appearing vaginal mucosa and cervix.  IUD strings visualized, about 3 cm in length outside cervix.   Assessment & Plan:  Normal IUD check. Patient to keep IUD in place for five years; can come in for removal if she desires pregnancy within the next five years. Routine preventative health maintenance measures emphasized.    , M.D. Attending [facility])

## 2021-03-02 ENCOUNTER — Other Ambulatory Visit: Payer: Self-pay

## 2021-03-02 ENCOUNTER — Ambulatory Visit (INDEPENDENT_AMBULATORY_CARE_PROVIDER_SITE_OTHER): Payer: 59 | Admitting: Family Medicine

## 2021-03-02 ENCOUNTER — Encounter: Payer: Self-pay | Admitting: Family Medicine

## 2021-03-02 VITALS — BP 136/66 | HR 68 | Ht 62.0 in | Wt 270.0 lb

## 2021-03-02 DIAGNOSIS — L918 Other hypertrophic disorders of the skin: Secondary | ICD-10-CM | POA: Diagnosis not present

## 2021-03-02 NOTE — Progress Notes (Signed)
Patient states the vaginal bump has been present for approx six months. Armandina Stammer RN

## 2021-03-02 NOTE — Progress Notes (Signed)
Seen for bump on thigh, soft. Has been there for 6 weeks. Gets caught on clothes and gets irritated.  1cm Skin tag with broad base. Injected with 60mL lidocaine with epi and removed sterilely with knife. 2 interrupted sutures placed. Return 1 week for suture removal.  Levie Heritage, DO

## 2021-03-10 ENCOUNTER — Ambulatory Visit (INDEPENDENT_AMBULATORY_CARE_PROVIDER_SITE_OTHER): Payer: 59 | Admitting: Family Medicine

## 2021-03-10 ENCOUNTER — Other Ambulatory Visit: Payer: Self-pay

## 2021-03-10 ENCOUNTER — Encounter: Payer: Self-pay | Admitting: Family Medicine

## 2021-03-10 VITALS — BP 159/88 | HR 76 | Ht 62.0 in | Wt 264.0 lb

## 2021-03-10 DIAGNOSIS — L918 Other hypertrophic disorders of the skin: Secondary | ICD-10-CM

## 2021-03-10 NOTE — Progress Notes (Signed)
Patient Name: Regina Mcdonald, female   DOB: 1971/04/29, 50 y.o.  MRN: [id number]  F/u for skin tag removal. Healing well. Stitch removed. F/u prn.  Levie Heritage, DO

## 2021-04-28 ENCOUNTER — Other Ambulatory Visit (HOSPITAL_COMMUNITY): Payer: Self-pay

## 2021-04-28 ENCOUNTER — Other Ambulatory Visit (HOSPITAL_COMMUNITY)
Admission: RE | Admit: 2021-04-28 | Discharge: 2021-04-28 | Disposition: A | Discharge status: Home / Self Care | Payer: 59 | Source: Ambulatory Visit | Attending: Infectious Disease | Admitting: Infectious Disease

## 2021-04-28 ENCOUNTER — Other Ambulatory Visit: Payer: Self-pay

## 2021-04-28 ENCOUNTER — Ambulatory Visit (INDEPENDENT_AMBULATORY_CARE_PROVIDER_SITE_OTHER): Payer: 59 | Admitting: Infectious Disease

## 2021-04-28 VITALS — BP 124/80 | HR 76 | Resp 16 | Ht 62.0 in | Wt 265.0 lb

## 2021-04-28 DIAGNOSIS — E669 Obesity, unspecified: Secondary | ICD-10-CM

## 2021-04-28 DIAGNOSIS — Z23 Encounter for immunization: Secondary | ICD-10-CM | POA: Diagnosis not present

## 2021-04-28 DIAGNOSIS — B20 Human immunodeficiency virus [HIV] disease: Secondary | ICD-10-CM

## 2021-04-28 DIAGNOSIS — L989 Disorder of the skin and subcutaneous tissue, unspecified: Secondary | ICD-10-CM | POA: Diagnosis not present

## 2021-04-28 NOTE — Progress Notes (Signed)
Subjective:  Chief complaint: Follow-up for HIV disease on medications  Patient ID: Regina Mcdonald, female    DOB: 09-24-1971, 50 y.o.   MRN: [id number]  HPI  50 year old African American woman with morbid obesity and HIV who has appeared to be a long term non-progressor. We had  Her previously on Atripla via START study   She then about of care for some time.  We tried to place her on Dovato but her insurance company would not cover it so we placed her on BIKTARVY which she is taking currently.  We discussed different antiretroviral regimens and she is actually quite interested in [place].    Past Medical History:  Diagnosis Date   Depression 05/16/2020   Moodiness 07/18/2020   Obesity (BMI 30-39.9) 05/16/2020    No past surgical history on file.  Family History  Problem Relation Age of Onset   Cancer Neg Hx    Hypertension Neg Hx    Diabetes Neg Hx       Social History   Socioeconomic History   Marital status: Widowed    Spouse name: Not on file   Number of children: Not on file   Years of education: Not on file   Highest education level: Not on file  Occupational History   Not on file  Tobacco Use   Smoking status: Never   Smokeless tobacco: Never  Vaping Use   Vaping Use: Never used  Substance and Sexual Activity   Alcohol use: No   Drug use: No   Sexual activity: Not Currently  Other Topics Concern   Not on file  Social History Narrative   Not on file   Social Determinants of Health   Financial Resource Strain: Not on file  Food Insecurity: Not on file  Transportation Needs: Not on file  Physical Activity: Not on file  Stress: Not on file  Social Connections: Not on file    No Known Allergies   Current Outpatient Medications:    bictegravir-emtricitabine-tenofovir AF (BIKTARVY) 50-200-25 MG TABS tablet, Take 1 tablet by mouth daily., Disp: , Rfl:    levonorgestrel (LILETTA, 52 MG,) 19.5 MCG/DAY IUD IUD, by Intrauterine route., Disp: , Rfl:     phentermine (ADIPEX-P) 37.5 MG tablet, Take by mouth every morning., Disp: , Rfl:    albuterol (PROVENTIL,VENTOLIN) 90 MCG/ACT inhaler, Inhale 1 puff into the lungs every 6 (six) hours as needed.   (Patient not taking: Reported on 04/28/2021), Disp: , Rfl:    Review of Systems  Constitutional:  Negative for chills and fever.  HENT:  Negative for congestion and sore throat.   Eyes:  Negative for photophobia.  Respiratory:  Negative for cough, shortness of breath and wheezing.   Cardiovascular:  Negative for chest pain, palpitations and leg swelling.  Gastrointestinal:  Negative for abdominal pain, blood in stool, constipation, diarrhea, nausea and vomiting.  Genitourinary:  Negative for dysuria, flank pain and hematuria.  Musculoskeletal:  Negative for back pain and myalgias.  Skin:  Negative for rash.  Neurological:  Negative for dizziness, weakness and headaches.  Hematological:  Does not bruise/bleed easily.  Psychiatric/Behavioral:  Negative for hallucinations and suicidal ideas. The patient is not nervous/anxious.       Objective:   Physical Exam Constitutional:      General: She is not in acute distress.    Appearance: She is not diaphoretic.  HENT:     Head: Normocephalic and atraumatic.     Right Ear: External ear  normal.     Left Ear: External ear normal.     Nose: Nose normal.     Mouth/Throat:     Pharynx: No oropharyngeal exudate.  Eyes:     General: No scleral icterus.       Right eye: No discharge.        Left eye: No discharge.     Extraocular Movements: Extraocular movements intact.     Conjunctiva/sclera: Conjunctivae normal.  Cardiovascular:     Rate and Rhythm: Normal rate and regular rhythm.  Pulmonary:     Effort: Pulmonary effort is normal. No respiratory distress.     Breath sounds: No wheezing.  Abdominal:     General: There is no distension.     Palpations: Abdomen is soft.  Musculoskeletal:        General: No tenderness. Normal range of motion.      Cervical back: Normal range of motion and neck supple.  Lymphadenopathy:     Cervical: No cervical adenopathy.  Skin:    General: Skin is warm and dry.     Coloration: Skin is not jaundiced or pale.     Findings: No erythema, lesion or rash.  Neurological:     General: No focal deficit present.     Mental Status: She is alert and oriented to person, place, and time.     Coordination: Coordination normal.  Psychiatric:        Mood and Affect: Mood normal. Mood is not depressed.        Behavior: Behavior normal.        Thought Content: Thought content normal.        Judgment: Judgment normal.          Assessment & Plan:  HIV disease: as a long term non progressor  Check labs today continue Biktarvy for now but we are going to see if we can place her on Cabenuva with "direct to inject" and Q 64M dosing after she gets her loading doses    Obesity: Continue to work on weight reduction strategies.  Need for vaccination: gave her 4th dose of Pfizer today  I spent more than 40 minutes with the patient including greater than 50% of time in face to face counseling of the patient personally reviewing radiographs, along with pertinent laboratory microbiological data review of medical records and in coordination of his care.

## 2021-05-01 LAB — COMPLETE METABOLIC PANEL WITH GFR
AG Ratio: 1.6 (calc) (ref 1.0–2.5)
ALT: 13 U/L (ref 6–29)
AST: 20 U/L (ref 10–35)
Albumin: 4.5 g/dL (ref 3.6–5.1)
Alkaline phosphatase (APISO): 45 U/L (ref 37–153)
BUN: 12 mg/dL (ref 7–25)
CO2: 26 mmol/L (ref 20–32)
Calcium: 9.5 mg/dL (ref 8.6–10.4)
Chloride: 103 mmol/L (ref 98–110)
Creat: 0.9 mg/dL (ref 0.50–1.05)
GFR, Est African American: 86 mL/min/{1.73_m2} (ref >=60)
GFR, Est Non African American: 75 mL/min/{1.73_m2} (ref >=60)
Globulin: 2.8 g/dL (calc) (ref 1.9–3.7)
Glucose, Bld: 78 mg/dL (ref 65–99)
Potassium: 3.9 mmol/L (ref 3.5–5.3)
Sodium: 139 mmol/L (ref 135–146)
Total Bilirubin: 0.7 mg/dL (ref 0.2–1.2)
Total Protein: 7.3 g/dL (ref 6.1–8.1)

## 2021-05-01 LAB — LIPID PANEL
Cholesterol: 142 mg/dL (ref <200)
HDL: 44 mg/dL — ABNORMAL LOW (ref >=50)
LDL Cholesterol (Calc): 83 mg/dL (calc)
Non-HDL Cholesterol (Calc): 98 mg/dL (calc) (ref <130)
Total CHOL/HDL Ratio: 3.2 (calc) (ref <5.0)
Triglycerides: 74 mg/dL (ref <150)

## 2021-05-01 LAB — CBC WITH DIFFERENTIAL/PLATELET
Absolute Monocytes: 308 cells/uL (ref 200–950)
Basophils Absolute: 22 cells/uL (ref 0–200)
Basophils Relative: 0.4 %
Eosinophils Absolute: 39 cells/uL (ref 15–500)
Eosinophils Relative: 0.7 %
HCT: 35.7 % (ref 35.0–45.0)
Hemoglobin: 11.5 g/dL — ABNORMAL LOW (ref 11.7–15.5)
Lymphs Abs: 1630 cells/uL (ref 850–3900)
MCH: 29.2 pg (ref 27.0–33.0)
MCHC: 32.2 g/dL (ref 32.0–36.0)
MCV: 90.6 fL (ref 80.0–100.0)
MPV: 10.6 fL (ref 7.5–12.5)
Monocytes Relative: 5.5 %
Neutro Abs: 3601 cells/uL (ref 1500–7800)
Neutrophils Relative %: 64.3 %
Platelets: 276 10*3/uL (ref 140–400)
RBC: 3.94 10*6/uL (ref 3.80–5.10)
RDW: 13.2 % (ref 11.0–15.0)
Total Lymphocyte: 29.1 %
WBC: 5.6 10*3/uL (ref 3.8–10.8)

## 2021-05-01 LAB — HEMOGLOBIN A1C
Hgb A1c MFr Bld: 5.8 % of total Hgb — ABNORMAL HIGH (ref <5.7)
Mean Plasma Glucose: 120 mg/dL
eAG (mmol/L): 6.6 mmol/L

## 2021-05-01 LAB — HIV-1 RNA QUANT-NO REFLEX-BLD
HIV 1 RNA Quant: NOT DETECTED Copies/mL
HIV-1 RNA Quant, Log: NOT DETECTED Log cps/mL

## 2021-05-01 LAB — URINE CYTOLOGY ANCILLARY ONLY
Chlamydia: NEGATIVE
Comment: NEGATIVE
Comment: NORMAL
Neisseria Gonorrhea: NEGATIVE

## 2021-05-01 LAB — RPR: RPR Ser Ql: NONREACTIVE

## 2021-06-07 ENCOUNTER — Ambulatory Visit: Payer: 59

## 2021-06-07 ENCOUNTER — Other Ambulatory Visit: Payer: Self-pay

## 2021-06-09 ENCOUNTER — Encounter: Payer: Self-pay | Admitting: Infectious Disease

## 2021-07-17 ENCOUNTER — Other Ambulatory Visit: Payer: Self-pay | Admitting: Pharmacist

## 2021-09-27 ENCOUNTER — Telehealth: Payer: Self-pay

## 2021-09-27 ENCOUNTER — Ambulatory Visit (INDEPENDENT_AMBULATORY_CARE_PROVIDER_SITE_OTHER): Payer: 59 | Admitting: Infectious Disease

## 2021-09-27 ENCOUNTER — Other Ambulatory Visit (HOSPITAL_COMMUNITY)
Admission: RE | Admit: 2021-09-27 | Discharge: 2021-09-27 | Disposition: A | Discharge status: Home / Self Care | Payer: 59 | Source: Ambulatory Visit | Attending: Infectious Disease | Admitting: Infectious Disease

## 2021-09-27 ENCOUNTER — Encounter: Payer: Self-pay | Admitting: Infectious Disease

## 2021-09-27 ENCOUNTER — Other Ambulatory Visit: Payer: Self-pay

## 2021-09-27 ENCOUNTER — Ambulatory Visit (INDEPENDENT_AMBULATORY_CARE_PROVIDER_SITE_OTHER): Payer: 59

## 2021-09-27 ENCOUNTER — Other Ambulatory Visit (HOSPITAL_COMMUNITY): Payer: Self-pay

## 2021-09-27 VITALS — BP 142/92 | HR 74 | Resp 16 | Ht 62.0 in | Wt 253.6 lb

## 2021-09-27 DIAGNOSIS — Z113 Encounter for screening for infections with a predominantly sexual mode of transmission: Secondary | ICD-10-CM | POA: Diagnosis present

## 2021-09-27 DIAGNOSIS — B20 Human immunodeficiency virus [HIV] disease: Secondary | ICD-10-CM

## 2021-09-27 DIAGNOSIS — Z23 Encounter for immunization: Secondary | ICD-10-CM

## 2021-09-27 HISTORY — DX: Encounter for screening for infections with a predominantly sexual mode of transmission: Z11.3

## 2021-09-27 NOTE — Progress Notes (Signed)
Subjective:  Chief complaint: concerns that her boyfriend might have given her an STI  Patient ID: Regina Mcdonald, female    DOB: Jan 15, 1971, 50 y.o.   MRN: [id number]  HPI  50 year old African American woman with morbid obesity and HIV who has appeared to be a long term non-progressor. We had  Her previously on Atripla via START study   She then about of care for some time.  We tried to place her on Dovato but her insurance company would not cover it so we placed her on [place] which she is taking currently.  We discussed different antiretroviral regimens at prior visit and she is actually quite interested in [place].  She came to clinic today due to concerns for STI. She says she is not certain that her boyfriend is only having sex with her and she is worried about risk for STI's.   She denies having oral or receptive anal intercourse and with vaginal receptive intercourse she says they always use condoms.    Past Medical History:  Diagnosis Date   Depression 05/16/2020   Moodiness 07/18/2020   Obesity (BMI 30-39.9) 05/16/2020   Routine screening for STI (sexually transmitted infection) 09/27/2021    No past surgical history on file.  Family History  Problem Relation Age of Onset   Cancer Neg Hx    Hypertension Neg Hx    Diabetes Neg Hx       Social History   Socioeconomic History   Marital status: Widowed    Spouse name: Not on file   Number of children: Not on file   Years of education: Not on file   Highest education level: Not on file  Occupational History   Not on file  Tobacco Use   Smoking status: Never   Smokeless tobacco: Never  Vaping Use   Vaping Use: Never used  Substance and Sexual Activity   Alcohol use: No   Drug use: No   Sexual activity: Not Currently  Other Topics Concern   Not on file  Social History Narrative   Not on file   Social Determinants of Health   Financial Resource Strain: Not on file  Food Insecurity: Not on file   Transportation Needs: Not on file  Physical Activity: Not on file  Stress: Not on file  Social Connections: Not on file    No Known Allergies   Current Outpatient Medications:    BIKTARVY 50-200-25 MG TABS tablet, TAKE 1 TABLET BY MOUTH DAILY, Disp: 30 tablet, Rfl: 3   phentermine (ADIPEX-P) 37.5 MG tablet, Take by mouth., Disp: , Rfl:    topiramate (TOPAMAX) 50 MG tablet, Take 1 tablet by mouth every evening., Disp: , Rfl:    Review of Systems  Constitutional:  Negative for activity change, appetite change, chills, diaphoresis, fatigue, fever and unexpected weight change.  HENT:  Negative for congestion, rhinorrhea, sinus pressure, sneezing, sore throat and trouble swallowing.   Eyes:  Negative for photophobia and visual disturbance.  Respiratory:  Negative for cough, chest tightness, shortness of breath, wheezing and stridor.   Cardiovascular:  Negative for chest pain, palpitations and leg swelling.  Gastrointestinal:  Negative for abdominal distention, abdominal pain, anal bleeding, blood in stool, constipation, diarrhea, nausea and vomiting.  Genitourinary:  Negative for difficulty urinating, dysuria, flank pain and hematuria.  Musculoskeletal:  Negative for arthralgias, back pain, gait problem, joint swelling and myalgias.  Skin:  Negative for color change, pallor, rash and wound.  Neurological:  Negative  for dizziness, tremors, weakness and light-headedness.  Hematological:  Negative for adenopathy. Does not bruise/bleed easily.  Psychiatric/Behavioral:  Negative for agitation, behavioral problems, confusion, decreased concentration, dysphoric mood and sleep disturbance.       Objective:   Physical Exam Constitutional:      General: She is not in acute distress.    Appearance: Normal appearance. She is well-developed. She is not ill-appearing or diaphoretic.  HENT:     Head: Normocephalic and atraumatic.     Right Ear: Hearing and external ear normal.     Left Ear:  Hearing and external ear normal.     Nose: No nasal deformity or rhinorrhea.  Eyes:     General: No scleral icterus.       Right eye: No discharge.        Left eye: No discharge.     Extraocular Movements: Extraocular movements intact.     Conjunctiva/sclera: Conjunctivae normal.     Right eye: Right conjunctiva is not injected.     Left eye: Left conjunctiva is not injected.     Pupils: Pupils are equal, round, and reactive to light.  Neck:     Vascular: No JVD.  Cardiovascular:     Rate and Rhythm: Normal rate and regular rhythm.     Heart sounds: S1 normal and S2 normal.  Pulmonary:     Effort: Pulmonary effort is normal. No respiratory distress.     Breath sounds: No wheezing.  Abdominal:     General: There is no distension.     Palpations: Abdomen is soft.     Tenderness: There is no abdominal tenderness.  Musculoskeletal:        General: Normal range of motion.     Right shoulder: Normal.     Left shoulder: Normal.     Cervical back: Normal range of motion and neck supple.     Right hip: Normal.     Left hip: Normal.     Right knee: Normal.     Left knee: Normal.  Lymphadenopathy:     Head:     Right side of head: No submandibular, preauricular or posterior auricular adenopathy.     Left side of head: No submandibular, preauricular or posterior auricular adenopathy.     Cervical: No cervical adenopathy.     Right cervical: No superficial or deep cervical adenopathy.    Left cervical: No superficial or deep cervical adenopathy.  Skin:    General: Skin is warm and dry.     Coloration: Skin is not pale.     Findings: No abrasion, bruising, ecchymosis, erythema, lesion or rash.     Nails: There is no clubbing.  Neurological:     General: No focal deficit present.     Mental Status: She is alert and oriented to person, place, and time. Mental status is at baseline.     Sensory: No sensory deficit.     Coordination: Coordination normal.     Gait: Gait normal.   Psychiatric:        Attention and Perception: She is attentive.        Speech: Speech normal.        Behavior: Behavior normal. Behavior is cooperative.        Thought Content: Thought content normal.        Judgment: Judgment normal.          Assessment & Plan:  . STI screening: will screen urine for GC and chlamydia, check RPR  HIV disease: recheck VL and CD4 CMP CBC w differential   I would like to switch her to [place] and discussed with Lupita Leash but she will need prior auth  She also is on ICAP and with [employer]. She believes she will have insurance through work but otherwise she needs to enroll into new [employer] plan promptly with ICAP or she will need to do HMAP  Vaccine counseling: she received flu and updated COVID 19 vaccine

## 2021-09-27 NOTE — Telephone Encounter (Signed)
[facility] Patient Advocate Encounter   Received notification from [employer] that prior authorization for Regina Mcdonald is required.   PA submitted on 09/27/21 Key [id number] Status is pending    [facility] will continue to follow.   Clearance Coots, CPhT Specialty Pharmacy Patient [facility] Phone: [phone] Fax:  [phone]

## 2021-09-28 LAB — T-HELPER CELL (CD4) - (RCID CLINIC ONLY)
CD4 % Helper T Cell: 54 % (ref 33–65)
CD4 T Cell Abs: 878 /uL (ref 400–1790)

## 2021-09-29 LAB — COMPLETE METABOLIC PANEL WITH GFR
AG Ratio: 1.5 (calc) (ref 1.0–2.5)
ALT: 12 U/L (ref 6–29)
AST: 16 U/L (ref 10–35)
Albumin: 4.4 g/dL (ref 3.6–5.1)
Alkaline phosphatase (APISO): 63 U/L (ref 37–153)
BUN/Creatinine Ratio: 14 (calc) (ref 6–22)
BUN: 16 mg/dL (ref 7–25)
CO2: 24 mmol/L (ref 20–32)
Calcium: 9.2 mg/dL (ref 8.6–10.4)
Chloride: 107 mmol/L (ref 98–110)
Creat: 1.12 mg/dL — ABNORMAL HIGH (ref 0.50–1.03)
Globulin: 2.9 g/dL (calc) (ref 1.9–3.7)
Glucose, Bld: 85 mg/dL (ref 65–99)
Potassium: 4.2 mmol/L (ref 3.5–5.3)
Sodium: 142 mmol/L (ref 135–146)
Total Bilirubin: 0.3 mg/dL (ref 0.2–1.2)
Total Protein: 7.3 g/dL (ref 6.1–8.1)
eGFR: 60 mL/min/{1.73_m2} (ref >=60)

## 2021-09-29 LAB — LIPID PANEL
Cholesterol: 146 mg/dL (ref <200)
HDL: 54 mg/dL (ref >=50)
LDL Cholesterol (Calc): 73 mg/dL (calc)
Non-HDL Cholesterol (Calc): 92 mg/dL (calc) (ref <130)
Total CHOL/HDL Ratio: 2.7 (calc) (ref <5.0)
Triglycerides: 104 mg/dL (ref <150)

## 2021-09-29 LAB — CBC WITH DIFFERENTIAL/PLATELET
Absolute Monocytes: 378 cells/uL (ref 200–950)
Basophils Absolute: 13 cells/uL (ref 0–200)
Basophils Relative: 0.2 %
Eosinophils Absolute: 50 cells/uL (ref 15–500)
Eosinophils Relative: 0.8 %
HCT: 37.1 % (ref 35.0–45.0)
Hemoglobin: 12.1 g/dL (ref 11.7–15.5)
Lymphs Abs: 1682 cells/uL (ref 850–3900)
MCH: 29.7 pg (ref 27.0–33.0)
MCHC: 32.6 g/dL (ref 32.0–36.0)
MCV: 90.9 fL (ref 80.0–100.0)
MPV: 10.4 fL (ref 7.5–12.5)
Monocytes Relative: 6 %
Neutro Abs: 4177 cells/uL (ref 1500–7800)
Neutrophils Relative %: 66.3 %
Platelets: 304 10*3/uL (ref 140–400)
RBC: 4.08 10*6/uL (ref 3.80–5.10)
RDW: 13 % (ref 11.0–15.0)
Total Lymphocyte: 26.7 %
WBC: 6.3 10*3/uL (ref 3.8–10.8)

## 2021-09-29 LAB — URINE CYTOLOGY ANCILLARY ONLY
Chlamydia: NEGATIVE
Comment: NEGATIVE
Comment: NORMAL
Neisseria Gonorrhea: NEGATIVE

## 2021-09-29 LAB — HIV-1 RNA QUANT-NO REFLEX-BLD
HIV 1 RNA Quant: NOT DETECTED Copies/mL
HIV-1 RNA Quant, Log: NOT DETECTED Log cps/mL

## 2021-09-29 LAB — RPR: RPR Ser Ql: NONREACTIVE

## 2021-10-11 ENCOUNTER — Ambulatory Visit: Payer: 59 | Admitting: Family Medicine

## 2021-10-18 ENCOUNTER — Other Ambulatory Visit: Payer: 59

## 2021-11-01 ENCOUNTER — Ambulatory Visit: Payer: 59 | Admitting: Infectious Disease

## 2021-11-01 ENCOUNTER — Encounter: Payer: Self-pay | Admitting: Infectious Disease

## 2021-11-01 ENCOUNTER — Other Ambulatory Visit: Payer: Self-pay

## 2021-11-01 VITALS — BP 160/89 | HR 90 | Temp 98.3°F | Wt 262.0 lb

## 2021-11-01 DIAGNOSIS — Z7185 Encounter for immunization safety counseling: Secondary | ICD-10-CM

## 2021-11-01 DIAGNOSIS — B20 Human immunodeficiency virus [HIV] disease: Secondary | ICD-10-CM | POA: Diagnosis not present

## 2021-11-01 MED ORDER — BIKTARVY 50-200-25 MG PO TABS: 1.0000 | ORAL_TABLET | Freq: Every day | ORAL | 11 refills | Status: DC

## 2021-11-01 NOTE — Progress Notes (Signed)
Subjective:  Chief complaint: Follow-up for HIV disease on medication  Patient ID: Regina Mcdonald, female    DOB: 02/09/1971, 50 y.o.   MRN: [id number]  HPI  50 year old African American woman with morbid obesity and HIV who has appeared to be a long term non-progressor. We had  Her previously on Atripla via START study   She then about of care for some time.  We tried to place her on Dovato but her insurance company would not cover it so we placed her on [place] which she is taking currently.  We discussed different antiretroviral regimens at prior visit and she is actually quite interested in [place].  She came to clinic at last visit due to concerns for STI. She says she is not certain that her boyfriend is only having sex with her and she is worried about risk for STI's.   Has a negative and all screening for STIs.  I endeavored to get Cabenuva  for her yet but was not approved by her insurance    Past Medical History:  Diagnosis Date   Depression 05/16/2020   Moodiness 07/18/2020   Obesity (BMI 30-39.9) 05/16/2020   Routine screening for STI (sexually transmitted infection) 09/27/2021    No past surgical history on file.  Family History  Problem Relation Age of Onset   Cancer Neg Hx    Hypertension Neg Hx    Diabetes Neg Hx       Social History   Socioeconomic History   Marital status: Widowed    Spouse name: Not on file   Number of children: Not on file   Years of education: Not on file   Highest education level: Not on file  Occupational History   Not on file  Tobacco Use   Smoking status: Never   Smokeless tobacco: Never  Vaping Use   Vaping Use: Never used  Substance and Sexual Activity   Alcohol use: No   Drug use: No   Sexual activity: Not Currently  Other Topics Concern   Not on file  Social History Narrative   Not on file   Social Determinants of Health   Financial Resource Strain: Not on file  Food Insecurity: Not on file  Transportation  Needs: Not on file  Physical Activity: Not on file  Stress: Not on file  Social Connections: Not on file    No Known Allergies   Current Outpatient Medications:    BIKTARVY 50-200-25 MG TABS tablet, TAKE 1 TABLET BY MOUTH DAILY, Disp: 30 tablet, Rfl: 3   phentermine (ADIPEX-P) 37.5 MG tablet, Take by mouth., Disp: , Rfl:    topiramate (TOPAMAX) 50 MG tablet, Take 1 tablet by mouth every evening., Disp: , Rfl:    Review of Systems  Constitutional:  Negative for activity change, appetite change, chills, diaphoresis, fatigue, fever and unexpected weight change.  HENT:  Negative for congestion, rhinorrhea, sinus pressure, sneezing, sore throat and trouble swallowing.   Eyes:  Negative for photophobia and visual disturbance.  Respiratory:  Negative for cough, chest tightness, shortness of breath, wheezing and stridor.   Cardiovascular:  Negative for chest pain, palpitations and leg swelling.  Gastrointestinal:  Negative for abdominal distention, abdominal pain, anal bleeding, blood in stool, constipation, diarrhea, nausea and vomiting.  Genitourinary:  Negative for difficulty urinating, dysuria, flank pain and hematuria.  Musculoskeletal:  Negative for arthralgias, back pain, gait problem, joint swelling and myalgias.  Skin:  Negative for color change, pallor, rash and wound.  Neurological:  Negative for dizziness, tremors, weakness and light-headedness.  Hematological:  Negative for adenopathy. Does not bruise/bleed easily.  Psychiatric/Behavioral:  Negative for agitation, behavioral problems, confusion, decreased concentration, dysphoric mood and sleep disturbance.       Objective:   Physical Exam Constitutional:      General: She is not in acute distress.    Appearance: Normal appearance. She is well-developed. She is obese. She is not ill-appearing or diaphoretic.  HENT:     Head: Normocephalic and atraumatic.     Right Ear: Hearing and external ear normal.     Left Ear: Hearing  and external ear normal.     Nose: No nasal deformity or rhinorrhea.  Eyes:     General: No scleral icterus.    Conjunctiva/sclera: Conjunctivae normal.     Right eye: Right conjunctiva is not injected.     Left eye: Left conjunctiva is not injected.     Pupils: Pupils are equal, round, and reactive to light.  Neck:     Vascular: No JVD.  Cardiovascular:     Rate and Rhythm: Normal rate and regular rhythm.     Heart sounds: S1 normal and S2 normal.  Pulmonary:     Effort: No respiratory distress.     Breath sounds: No wheezing.  Abdominal:     General: Bowel sounds are normal. There is no distension.     Palpations: Abdomen is soft.     Tenderness: There is no abdominal tenderness.  Musculoskeletal:        General: Normal range of motion.     Right shoulder: Normal.     Left shoulder: Normal.     Cervical back: Normal range of motion and neck supple.     Right hip: Normal.     Left hip: Normal.     Right knee: Normal.     Left knee: Normal.  Lymphadenopathy:     Head:     Right side of head: No submandibular, preauricular or posterior auricular adenopathy.     Left side of head: No submandibular, preauricular or posterior auricular adenopathy.     Cervical: No cervical adenopathy.     Right cervical: No superficial or deep cervical adenopathy.    Left cervical: No superficial or deep cervical adenopathy.  Skin:    General: Skin is warm and dry.     Coloration: Skin is not pale.     Findings: No abrasion, bruising, ecchymosis, erythema, lesion or rash.     Nails: There is no clubbing.  Neurological:     Mental Status: She is alert and oriented to person, place, and time.     Sensory: No sensory deficit.     Coordination: Coordination normal.     Gait: Gait normal.  Psychiatric:        Attention and Perception: She is attentive.        Speech: Speech normal.        Behavior: Behavior normal. Behavior is cooperative.        Thought Content: Thought content normal.         Judgment: Judgment normal.          Assessment & Plan:   HIV disease:  I reviewed her most recent viral load from November 9 which was not detected  Lab Results  Component Value Date   HIV1RNAQUANT Not Detected 09/27/2021       I have reviewed her most recent CD4 count from same date that was 878  Lab Results  Component Value Date   CD4TABS 878 09/27/2021   CD4TABS 799 05/16/2020   CD4TABS 750 03/01/2020     For now and continue her BIKTARVY has had in prescriptions but will still continue to appeal for Cabenuva and hopefully this will be approved and WHEN it is ringer in for first injection followed by month later another injection and then every 65-month injections.   Excellent counseling she is up-to-date on her vaccines including updated bivalent booster

## 2021-11-07 ENCOUNTER — Other Ambulatory Visit (HOSPITAL_COMMUNITY): Payer: Self-pay

## 2021-11-09 ENCOUNTER — Other Ambulatory Visit: Payer: Self-pay | Admitting: Pharmacist

## 2021-11-09 DIAGNOSIS — B20 Human immunodeficiency virus [HIV] disease: Secondary | ICD-10-CM

## 2021-11-09 MED ORDER — BIKTARVY 50-200-25 MG PO TABS: 1.0000 | ORAL_TABLET | Freq: Every day | ORAL | 0 refills | Status: AC

## 2021-11-09 NOTE — Progress Notes (Signed)
Medication Samples have been provided to the patient. ? ?Drug name: Biktarvy        ?Strength: 50/200/25 mg       ?Qty: 2 bottles (14 tablets)   ?LOT: [id number]   ?Exp.Date: 08/20/2023 ? ?Dosing instructions: Take one tablet by mouth once daily ? ?The patient has been instructed regarding the correct time, dose, and frequency of taking this medication, including desired effects and most common side effects.  ? ?Christina Waldrop L. Alhaji Mcneal, PharmD, BCIDP, AAHIVP, CPP ?Clinical Pharmacist Practitioner ?Infectious Diseases Clinical Pharmacist ?[facility] ?10/31/2020, 10:07 AM  ?

## 2021-12-29 ENCOUNTER — Emergency Department (HOSPITAL_BASED_OUTPATIENT_CLINIC_OR_DEPARTMENT_OTHER)
Admission: EM | Admit: 2021-12-29 | Discharge: 2021-12-29 | Disposition: A | Discharge status: Home / Self Care | Payer: 59 | Attending: Emergency Medicine | Admitting: Emergency Medicine

## 2021-12-29 ENCOUNTER — Other Ambulatory Visit: Payer: Self-pay

## 2021-12-29 ENCOUNTER — Encounter (HOSPITAL_BASED_OUTPATIENT_CLINIC_OR_DEPARTMENT_OTHER): Payer: Self-pay

## 2021-12-29 DIAGNOSIS — Z20822 Contact with and (suspected) exposure to covid-19: Secondary | ICD-10-CM | POA: Diagnosis not present

## 2021-12-29 DIAGNOSIS — R112 Nausea with vomiting, unspecified: Secondary | ICD-10-CM | POA: Diagnosis present

## 2021-12-29 DIAGNOSIS — R197 Diarrhea, unspecified: Secondary | ICD-10-CM | POA: Insufficient documentation

## 2021-12-29 DIAGNOSIS — R6883 Chills (without fever): Secondary | ICD-10-CM | POA: Diagnosis not present

## 2021-12-29 DIAGNOSIS — Z21 Asymptomatic human immunodeficiency virus [HIV] infection status: Secondary | ICD-10-CM | POA: Insufficient documentation

## 2021-12-29 DIAGNOSIS — R1030 Lower abdominal pain, unspecified: Secondary | ICD-10-CM | POA: Insufficient documentation

## 2021-12-29 LAB — URINALYSIS, ROUTINE W REFLEX MICROSCOPIC
Glucose, UA: NEGATIVE mg/dL
Ketones, ur: NEGATIVE mg/dL
Leukocytes,Ua: NEGATIVE
Nitrite: NEGATIVE
Protein, ur: 30 mg/dL — AB
Specific Gravity, Urine: >=1.03 (ref 1.005–1.030)
pH: 6 (ref 5.0–8.0)

## 2021-12-29 LAB — COMPREHENSIVE METABOLIC PANEL
ALT: 17 U/L (ref 0–44)
AST: 21 U/L (ref 15–41)
Albumin: 3.6 g/dL (ref 3.5–5.0)
Alkaline Phosphatase: 44 U/L (ref 38–126)
Anion gap: 9 (ref 5–15)
BUN: 15 mg/dL (ref 6–20)
CO2: 26 mmol/L (ref 22–32)
Calcium: 8.4 mg/dL — ABNORMAL LOW (ref 8.9–10.3)
Chloride: 103 mmol/L (ref 98–111)
Creatinine, Ser: 0.69 mg/dL (ref 0.44–1.00)
GFR, Estimated: >60 mL/min (ref >60)
Glucose, Bld: 115 mg/dL — ABNORMAL HIGH (ref 70–99)
Potassium: 3.3 mmol/L — ABNORMAL LOW (ref 3.5–5.1)
Sodium: 138 mmol/L (ref 135–145)
Total Bilirubin: 1.2 mg/dL (ref 0.3–1.2)
Total Protein: 6.8 g/dL (ref 6.5–8.1)

## 2021-12-29 LAB — RESP PANEL BY RT-PCR (FLU A&B, COVID) ARPGX2
Influenza A by PCR: NEGATIVE
Influenza B by PCR: NEGATIVE
SARS Coronavirus 2 by RT PCR: NEGATIVE

## 2021-12-29 LAB — CBC WITH DIFFERENTIAL/PLATELET
Abs Immature Granulocytes: 0.01 10*3/uL (ref 0.00–0.07)
Basophils Absolute: 0 10*3/uL (ref 0.0–0.1)
Basophils Relative: 0 %
Eosinophils Absolute: 0 10*3/uL (ref 0.0–0.5)
Eosinophils Relative: 1 %
HCT: 39.9 % (ref 36.0–46.0)
Hemoglobin: 12.7 g/dL (ref 12.0–15.0)
Immature Granulocytes: 0 %
Lymphocytes Relative: 8 %
Lymphs Abs: 0.4 10*3/uL — ABNORMAL LOW (ref 0.7–4.0)
MCH: 29.8 pg (ref 26.0–34.0)
MCHC: 31.8 g/dL (ref 30.0–36.0)
MCV: 93.7 fL (ref 80.0–100.0)
Monocytes Absolute: 0.3 10*3/uL (ref 0.1–1.0)
Monocytes Relative: 6 %
Neutro Abs: 3.8 10*3/uL (ref 1.7–7.7)
Neutrophils Relative %: 85 %
Platelets: 232 10*3/uL (ref 150–400)
RBC: 4.26 MIL/uL (ref 3.87–5.11)
RDW: 13 % (ref 11.5–15.5)
WBC: 4.4 10*3/uL (ref 4.0–10.5)
nRBC: 0 % (ref 0.0–0.2)

## 2021-12-29 LAB — URINALYSIS, MICROSCOPIC (REFLEX)

## 2021-12-29 LAB — LIPASE, BLOOD: Lipase: 29 U/L (ref 11–51)

## 2021-12-29 MED ORDER — POTASSIUM CHLORIDE CRYS ER 20 MEQ PO TBCR
40.0000 meq | EXTENDED_RELEASE_TABLET | Freq: Once | ORAL | Status: AC
  Administered 2021-12-29: 40 meq via ORAL
  Filled 2021-12-29: qty 2

## 2021-12-29 MED ORDER — DICYCLOMINE HCL 10 MG PO CAPS
10.0000 mg | ORAL_CAPSULE | Freq: Once | ORAL | Status: AC
  Administered 2021-12-29: 10 mg via ORAL
  Filled 2021-12-29: qty 1

## 2021-12-29 MED ORDER — ACETAMINOPHEN 325 MG PO TABS
650.0000 mg | ORAL_TABLET | Freq: Once | ORAL | Status: AC
  Administered 2021-12-29: 650 mg via ORAL
  Filled 2021-12-29: qty 2

## 2021-12-29 MED ORDER — ONDANSETRON HCL 4 MG/2ML IJ SOLN
4.0000 mg | Freq: Once | INTRAMUSCULAR | Status: AC
  Administered 2021-12-29: 4 mg via INTRAVENOUS
  Filled 2021-12-29: qty 2

## 2021-12-29 MED ORDER — ONDANSETRON HCL 4 MG PO TABS
4.0000 mg | ORAL_TABLET | Freq: Four times a day (QID) | ORAL | 0 refills | Status: DC
Start: 2021-12-29 — End: 2022-06-25

## 2021-12-29 MED ORDER — SODIUM CHLORIDE 0.9 % IV BOLUS
1000.0000 mL | Freq: Once | INTRAVENOUS | Status: AC
Start: 2021-12-29 — End: 2021-12-29
  Administered 2021-12-29: 1000 mL via INTRAVENOUS

## 2021-12-29 MED ORDER — DICYCLOMINE HCL 20 MG PO TABS: 20.0000 mg | ORAL_TABLET | Freq: Two times a day (BID) | ORAL | 0 refills | Status: DC

## 2021-12-29 NOTE — ED Provider Notes (Signed)
[facility] Provider Note   CSN: [id number] Arrival date & time: 12/29/21  0844     History  Chief Complaint  Patient presents with   Abdominal Pain    Regina Mcdonald is a 51 y.o. female.  HPI   Pt is a 51 y/o female with a h/o HIV, HLD, obesity, depression, who presents to the ED today for eval of abd pain that started yesterday. Discomfort is located to the lower abdomen. She states that discomfort feels like a mild cramping soreness. States pain is not severe.   She reports nausea, vomiting, diarrhea and chills yesterday. Denies hematochezia or melena.  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   Home Medications Prior to Admission medications   Medication Sig Start Date End Date Taking? Authorizing Provider  bictegravir-emtricitabine-tenofovir AF (BIKTARVY) 50-200-25 MG TABS tablet Take 1 tablet by mouth daily. 11/01/21  Yes Tommy Medal, Lavell Islam, MD  dicyclomine (BENTYL) 20 MG tablet Take 1 tablet (20 mg total) by mouth 2 (two) times daily. 12/29/21  Yes Lauree Yurick S, PA-C  ondansetron (ZOFRAN) 4 MG tablet Take 1 tablet (4 mg total) by mouth every 6 (six) hours. 12/29/21  Yes Zamara Cozad S, PA-C  topiramate (TOPAMAX) 50 MG tablet Take 1 tablet by mouth every evening. 09/19/21  Yes [provider]  phentermine (ADIPEX-P) 37.5 MG tablet Take by mouth. 09/19/21   [provider]      Allergies    Patient has no known allergies.    Review of Systems   Review of Systems See HPI for pertinent positives or negatives.   Physical Exam Updated Vital Signs BP 133/78    Pulse 74    Temp 99.5 F (37.5 C) (Oral)    Resp 18    Wt 114.8 kg    SpO2 99%    BMI 46.27 kg/m  Physical Exam Vitals and nursing note reviewed.  Constitutional:      General: She is not in acute distress.    Appearance: She is well-developed.  HENT:     Head: Normocephalic and  atraumatic.  Eyes:     Conjunctiva/sclera: Conjunctivae normal.  Cardiovascular:     Rate and Rhythm: Normal rate and regular rhythm.     Heart sounds: Normal heart sounds. No murmur heard. Pulmonary:     Effort: Pulmonary effort is normal. No respiratory distress.     Breath sounds: Normal breath sounds.  Abdominal:     General: Bowel sounds are normal.     Palpations: Abdomen is soft.     Tenderness: There is no abdominal tenderness. There is no guarding or rebound.  Musculoskeletal:        General: No swelling.     Cervical back: Neck supple.  Skin:    General: Skin is warm and dry.     Capillary Refill: Capillary refill takes less than 2 seconds.  Neurological:     Mental Status: She is alert.  Psychiatric:        Mood and Affect: Mood normal.    ED Results / Procedures / Treatments   Labs (all labs ordered are listed, but only abnormal results are displayed) Labs Reviewed  CBC WITH DIFFERENTIAL/PLATELET - Abnormal; Notable for the following components:      Result Value   Lymphs Abs 0.4 (*)    All other components within normal limits  COMPREHENSIVE METABOLIC PANEL - Abnormal; Notable  for the following components:   Potassium 3.3 (*)    Glucose, Bld 115 (*)    Calcium 8.4 (*)    All other components within normal limits  URINALYSIS, ROUTINE W REFLEX MICROSCOPIC - Abnormal; Notable for the following components:   Hgb urine dipstick MODERATE (*)    Bilirubin Urine SMALL (*)    Protein, ur 30 (*)    All other components within normal limits  URINALYSIS, MICROSCOPIC (REFLEX) - Abnormal; Notable for the following components:   Bacteria, UA FEW (*)    All other components within normal limits  RESP PANEL BY RT-PCR (FLU A&B, COVID) ARPGX2  LIPASE, BLOOD    EKG None  Radiology No results found.  Procedures Procedures   Medications Ordered in ED Medications  potassium chloride SA (KLOR-CON M) CR tablet 40 mEq (has no administration in time range)  ondansetron  (ZOFRAN) injection 4 mg (4 mg Intravenous Given 12/29/21 1007)  sodium chloride 0.9 % bolus 1,000 mL (1,000 mLs Intravenous New Bag/Given 12/29/21 1006)  dicyclomine (BENTYL) capsule 10 mg (10 mg Oral Given 12/29/21 0956)  acetaminophen (TYLENOL) tablet 650 mg (650 mg Oral Given 12/29/21 [id number])    ED Course/ Medical Decision Making/ A&P                           Medical Decision Making Amount and/or Complexity of Data Reviewed Labs: ordered.  Risk OTC drugs. Prescription drug management.   This patient presents to the ED for concern of nvd, abd pain, this involves an extensive number of treatment options, and is a complaint that carries with it a high risk of complications and morbidity.  The differential diagnosis includes gastroenteritis, cholecystitis, diverticulitis, appendicitis, bowel obstruction, uti/pyelo   Comorbidities that complicate the patient evaluation: Patients presentation is complicated by their history of hiv, dm, htn  Additional history obtained: Records reviewed previous admission documents and Care Everywhere/External Records  Lab Tests: I Ordered, and personally interpreted labs.  The pertinent results include:   CBC w/o leukocytosis or anemia CMP with mild hypokalemia. Otherwise unremarkable Lipase negative UA with hematuria, no signs of infection COVID/flu negative  Cardiac Monitoring: The patient was maintained on a cardiac monitor.  I personally viewed and interpreted the cardiac monitor which showed an underlying rhythm of:  sinus rhythm  Medicines ordered and prescription drug management: I ordered medication including zofran, bentyl, tylenol, bentyl  for abd discomfort, nv  Reevaluation of the patient after these medicines showed that the patient    improved  Test Considered: CT abd/pelvis Patient is low risk / negative by normal labs, normal abd exam, recent exposure to multiple people with similar sxs and this is likely viral infection, therefore  do not feel that CT is indicated.  Critical Interventions:  Antiemetics, pain meds, ivf   Reevaluation: After the interventions noted above, I reevaluated the patient and found that they have :improved  Complexity of problems addressed: Patients presentation is most consistent with  acute complicated illness/injury requiring diagnostic workup  Disposition: After consideration of the diagnostic results and the patients response to treatment,  I feel that the patent would benefit from discharge with PCP f/u. Suspect gastroenteritis. Doubt emergent cause of sxs. Tolerated ivf in the ED and repeat abd exam is benign. Pt in agreement to forgo ct imaging at this time. Will give rx for symptomatic tx at home. Advised on return precautions. She voices understanding and is in agreement with plan. All questions answered,  pt stable for discharge .   Final Clinical Impression(s) / ED Diagnoses Final diagnoses:  Nausea vomiting and diarrhea    Rx / DC Orders ED Discharge Orders          Ordered    ondansetron (ZOFRAN) 4 MG tablet  Every 6 hours        12/29/21 1101    dicyclomine (BENTYL) 20 MG tablet  2 times daily        12/29/21 [place], Ladrea Holladay S, PA-C 12/29/21 [place], [place], DO 12/29/21 1228

## 2021-12-29 NOTE — ED Triage Notes (Signed)
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 Reports one episode of vomiting and 4 loose stools. Also chills. Pain lower abdomen. Also reports she was around a family member on Saturday who tested positive for covid  on sunday

## 2021-12-29 NOTE — Discharge Instructions (Addendum)
You were given a prescription for zofran to help with your nausea. Please take as directed.  Take bentyl as prescribed for abdominal discomfort and cramping  Make sure to stay well hydrated   Please follow up with your primary doctor within the next 5-7 days.  If you do not have a primary care provider, information for a healthcare clinic has been provided for you to make arrangements for follow up care. Please return to the ER sooner if you have any new or worsening symptoms, or if you have any of the following symptoms:  Abdominal pain that does not go away.  You have a fever.  You keep throwing up (vomiting).  The pain is felt only in portions of the abdomen. Pain in the right side could possibly be appendicitis. In an adult, pain in the left lower portion of the abdomen could be colitis or diverticulitis.  You pass bloody or black tarry stools.  There is bright red blood in the stool.  The constipation stays for more than 4 days.  There is belly (abdominal) or rectal pain.  You do not seem to be getting better.  You have any questions or concerns.

## 2022-01-01 ENCOUNTER — Telehealth: Payer: Self-pay

## 2022-01-01 DIAGNOSIS — B20 Human immunodeficiency virus [HIV] disease: Secondary | ICD-10-CM

## 2022-01-01 MED ORDER — BIKTARVY 50-200-25 MG PO TABS: 1.0000 | ORAL_TABLET | Freq: Every day | ORAL | 5 refills | Status: DC

## 2022-01-01 NOTE — Telephone Encounter (Signed)
Due to patient insurance change, she now needs to use [facility] instead of [facility]. RN canceled [facility] with [facility] and sent to [facility]. Patient made aware. Reminded patient to schedule follow up.   Beryle Flock, RN

## 2022-06-22 IMAGING — US US TRANSVAGINAL NON-OB
1 series · 14 of 25 positions shown · non-contrast
Comparison: None available.

CLINICAL DATA: Initial evaluation for IUD placement, lost threads.

EXAM:
ULTRASOUND PELVIS TRANSVAGINAL
TECHNIQUE: Transvaginal ultrasound examination of the pelvis was performed
including evaluation of the uterus, ovaries, adnexal regions, and
pelvic cul-de-sac.

[Series 1: us transvaginal non-ob · 0.12mm/px · 64 acquisitions, 14 frames shown]
[im 1/64]
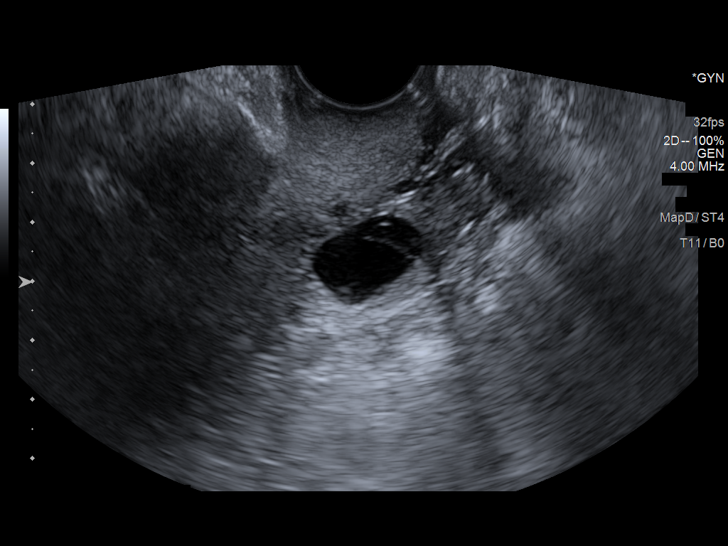
[im 6/64]
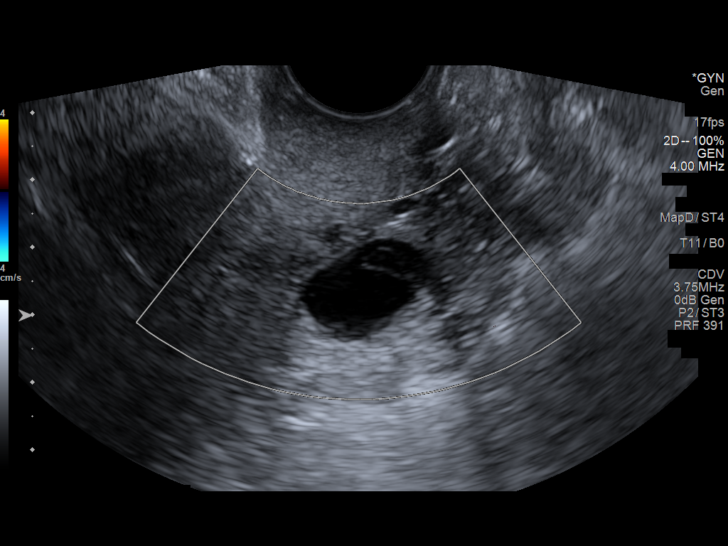
[im 11/64]
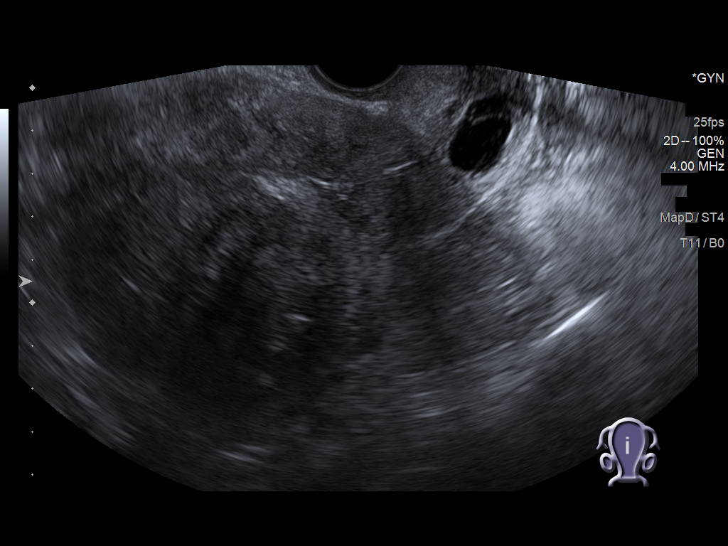
[im 16/64]
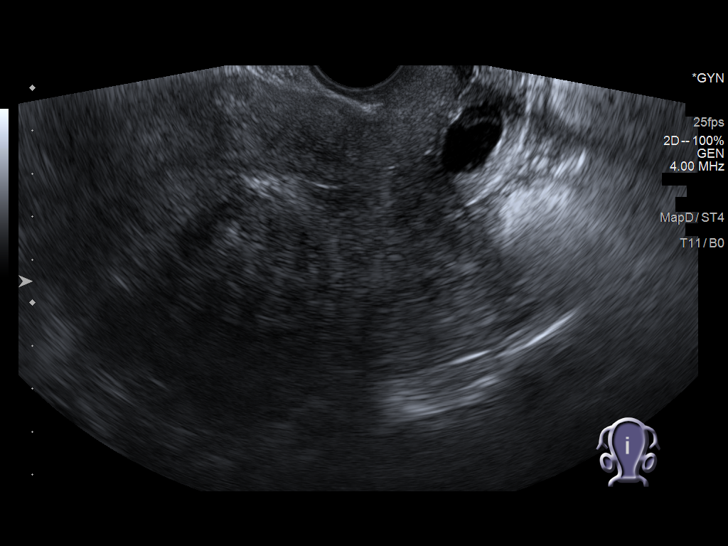
[im 22/64]
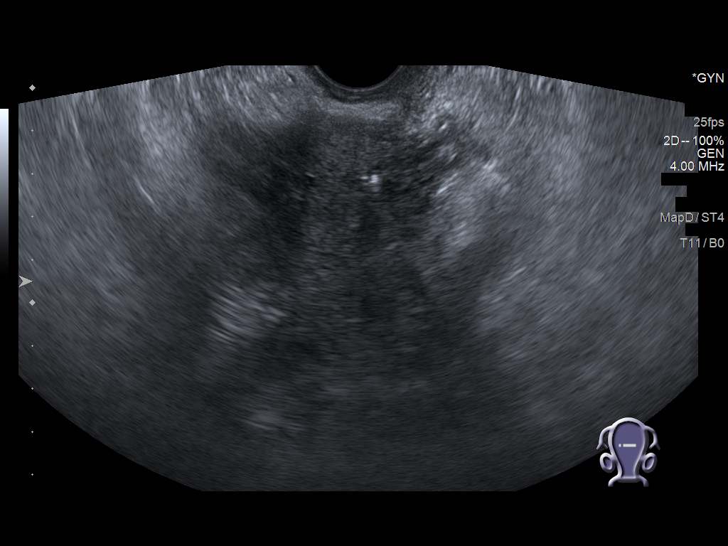
[im 24/64]
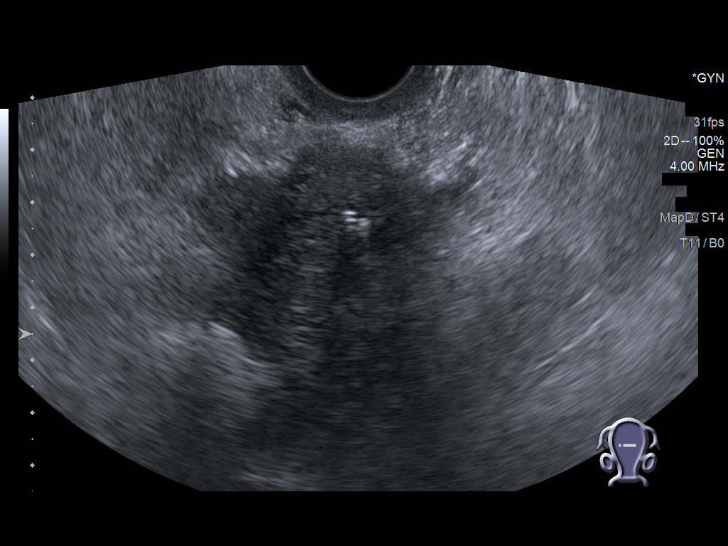
[im 29/64]
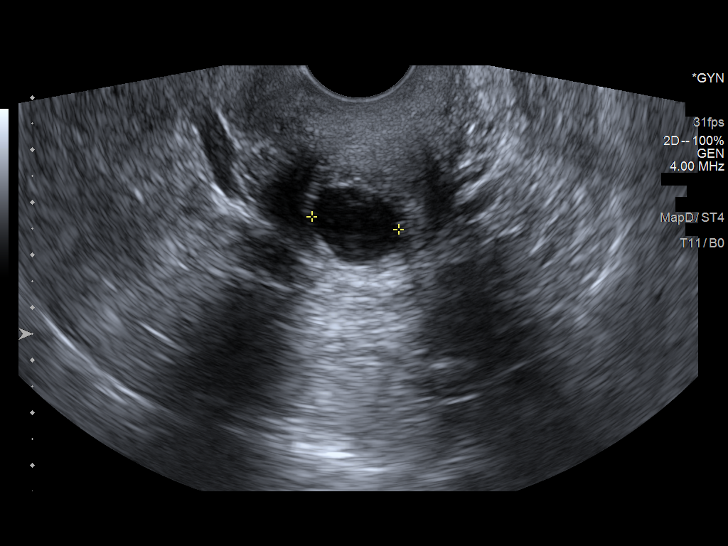
[im 35/64]
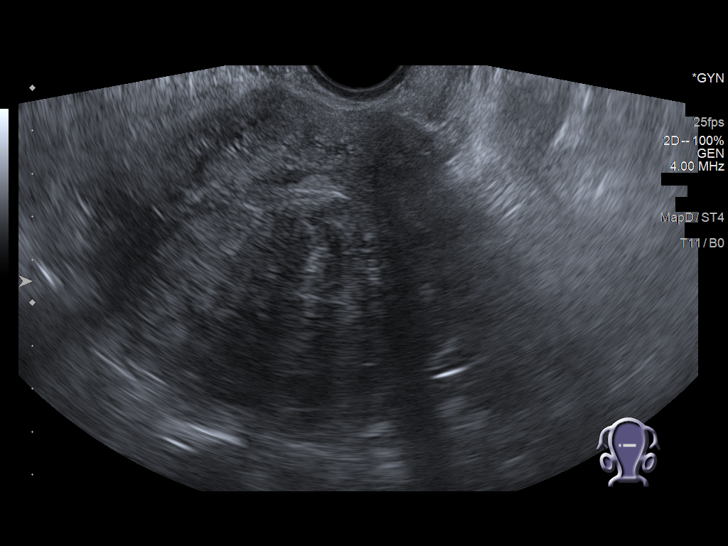
[im 40/64]
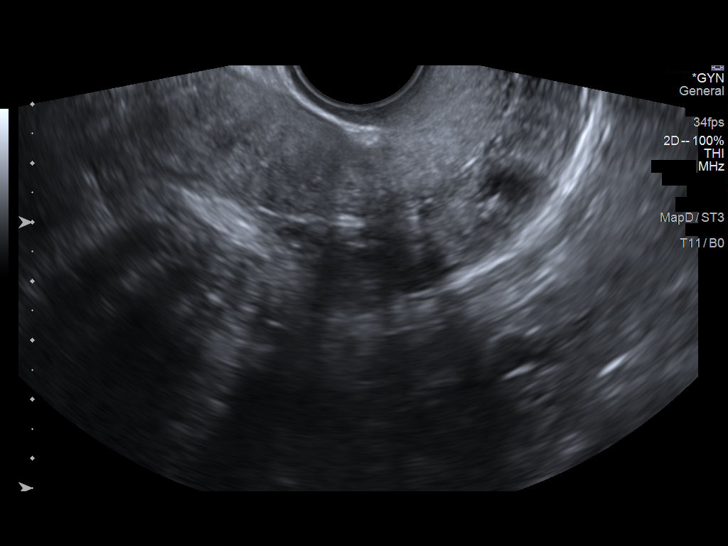
[im 43/64]
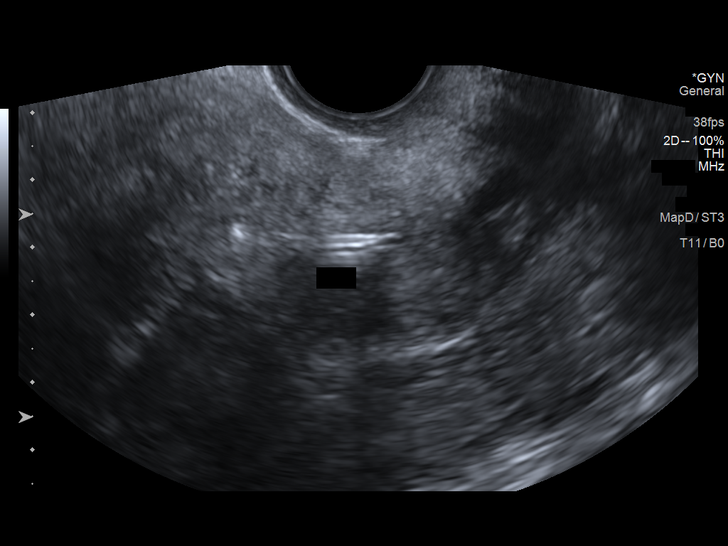
[im 48/64]
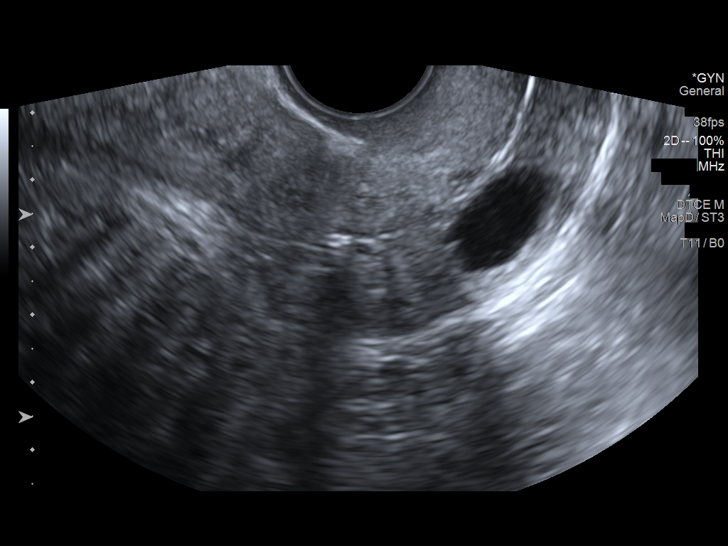
[im 53/64]
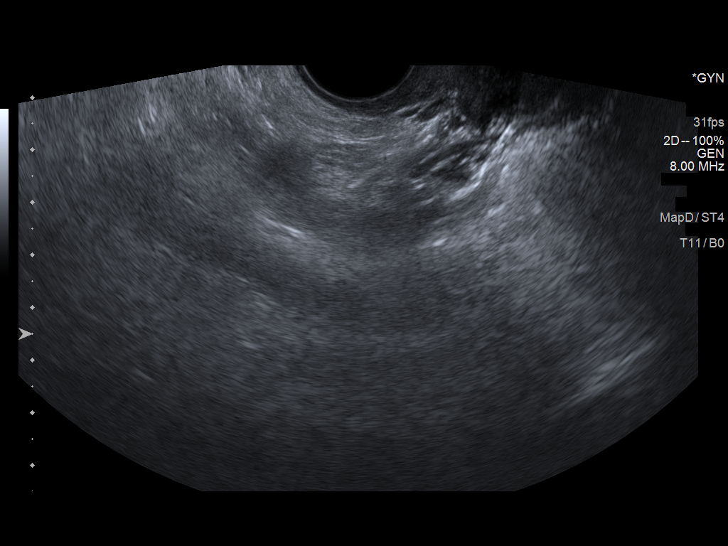
[im 58/64]
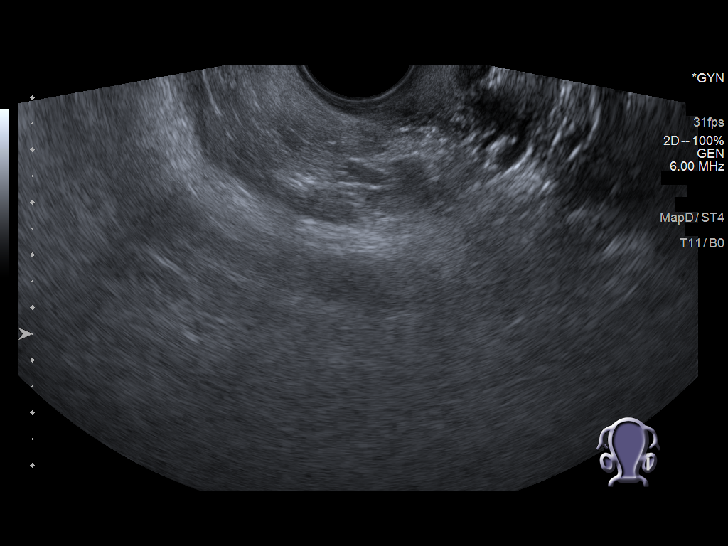
[im 64/64]
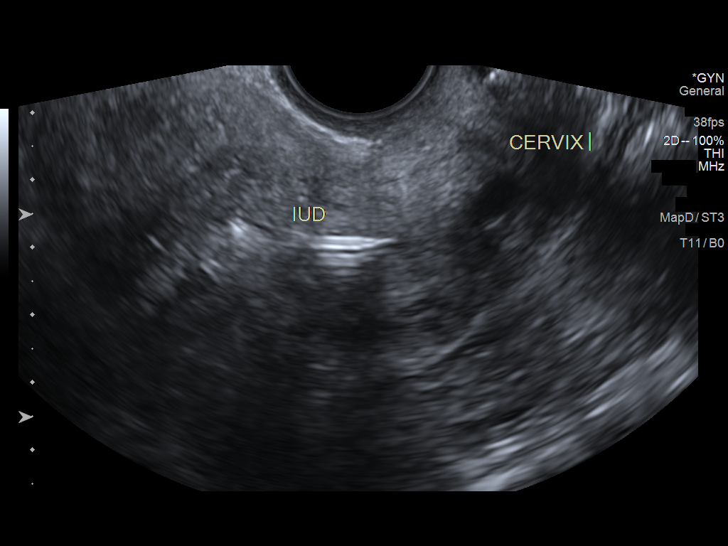

[14 of 25 positions shown; findings below may reference images not displayed]

FINDINGS: Uterus

Measurements: 11.5 x 7.8 x 9.0 cm. Large intramural fibroid at the
posterior uterine fundus measures 6.2 x 6.5 x 7.3 cm. 1.9 x 1.4 x
1.7 cm mildly complex nabothian cyst noted at the cervix.

Endometrium

Thickness: 1 mm. No focal abnormality visualized. IUD is seen low
lying at the level of the lower uterine segment, and appears to be
somewhat obliquely oriented, possibly invading the adjacent
myometrium.

Right ovary

Not visualized.  No adnexal mass.

Left ovary

Not visualized.  No adnexal mass.

Other findings:  No abnormal free fluid
IMPRESSION: 1. IUD low lying at the level of the lower uterine segment,
obliquely oriented and possibly invading the adjacent myometrium.
2. Large 7.3 cm intramural fibroid at the posterior uterine fundus.
3. Nonvisualization of either ovary.  No adnexal mass or free fluid.

## 2022-06-22 IMAGING — MG DIGITAL SCREENING BILAT W/ TOMO W/ CAD
8 series · 8 of 24 positions shown · non-contrast
Comparison: None.

CLINICAL DATA: Screening.

EXAM:
DIGITAL SCREENING BILATERAL MAMMOGRAM WITH TOMO AND CAD

[R CC synth-2D]
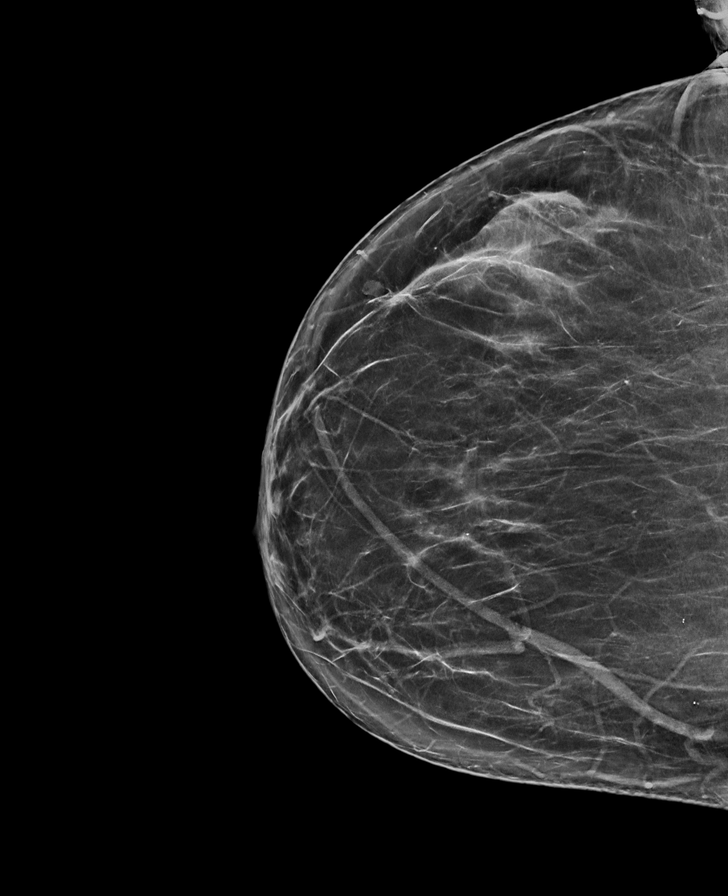

[R MLO synth-2D]
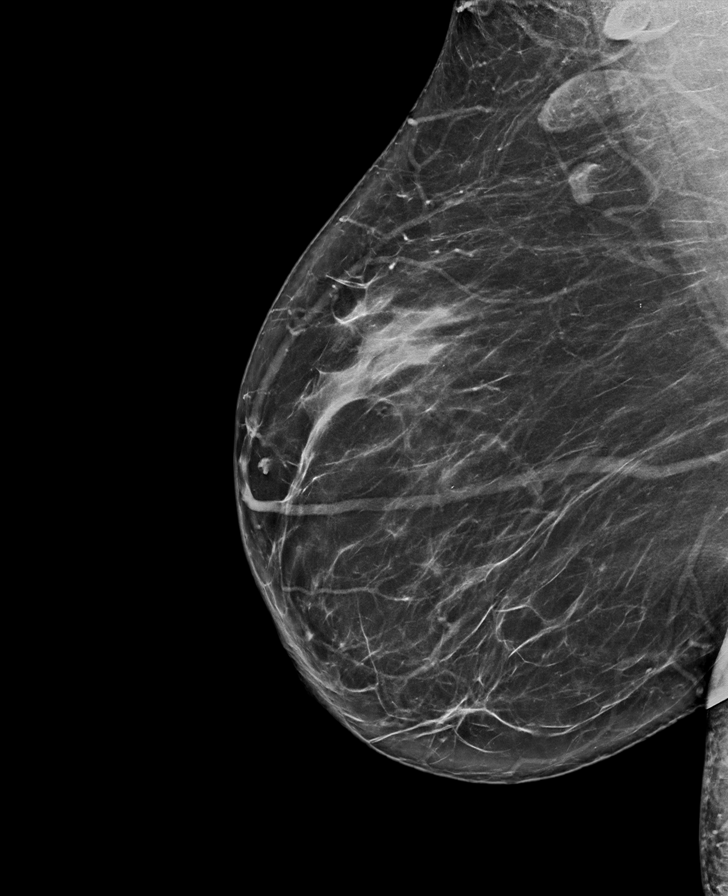

[L CC synth-2D]
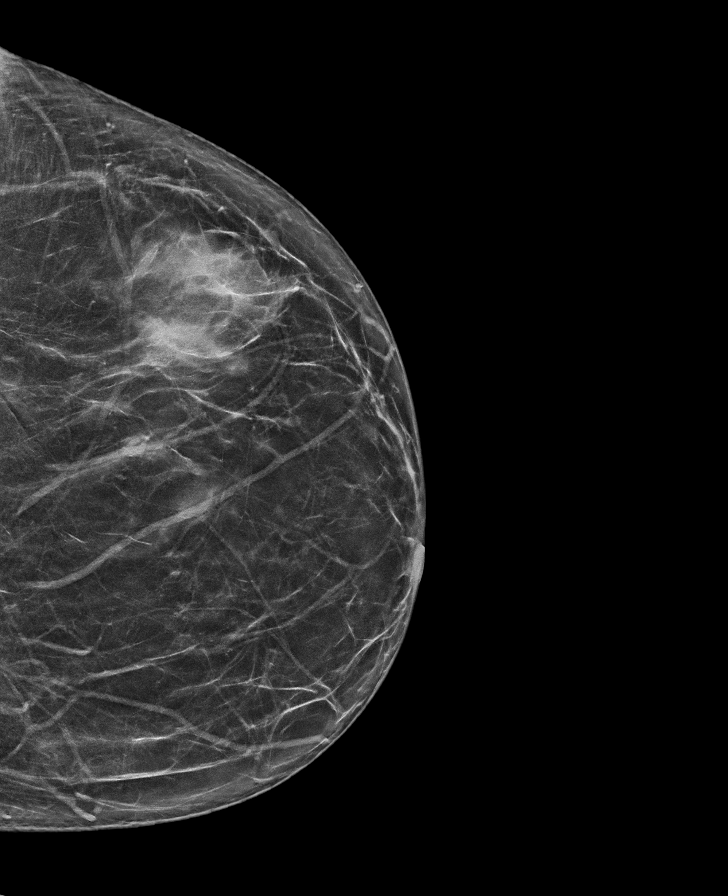

[L MLO synth-2D]
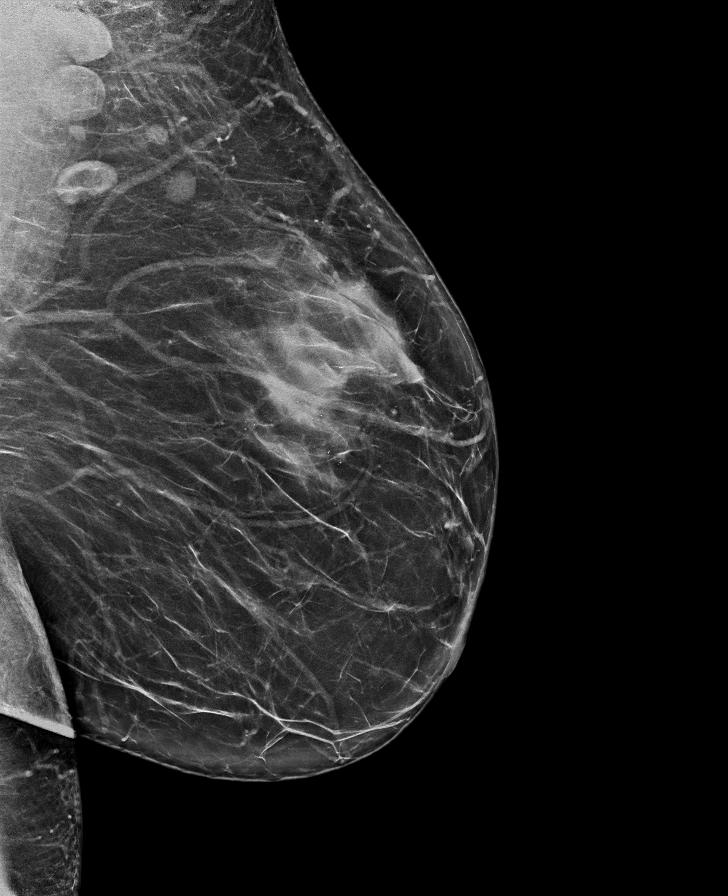

[L CC tomo · tomo slice 35/69.0]
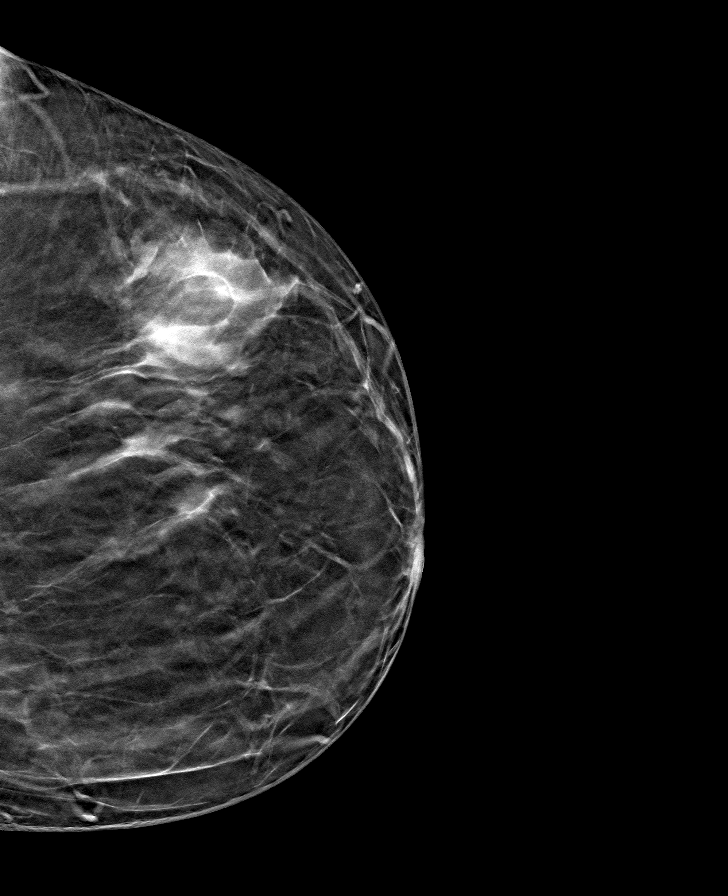

[L MLO tomo · tomo slice 42/83.0]
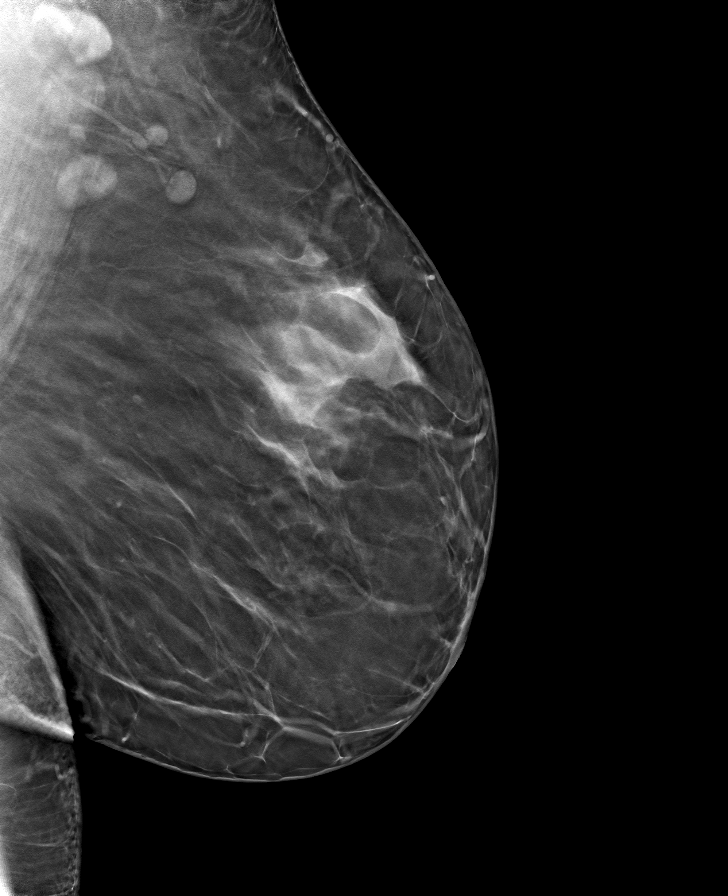

[R MLO tomo · tomo slice 41/80.0]
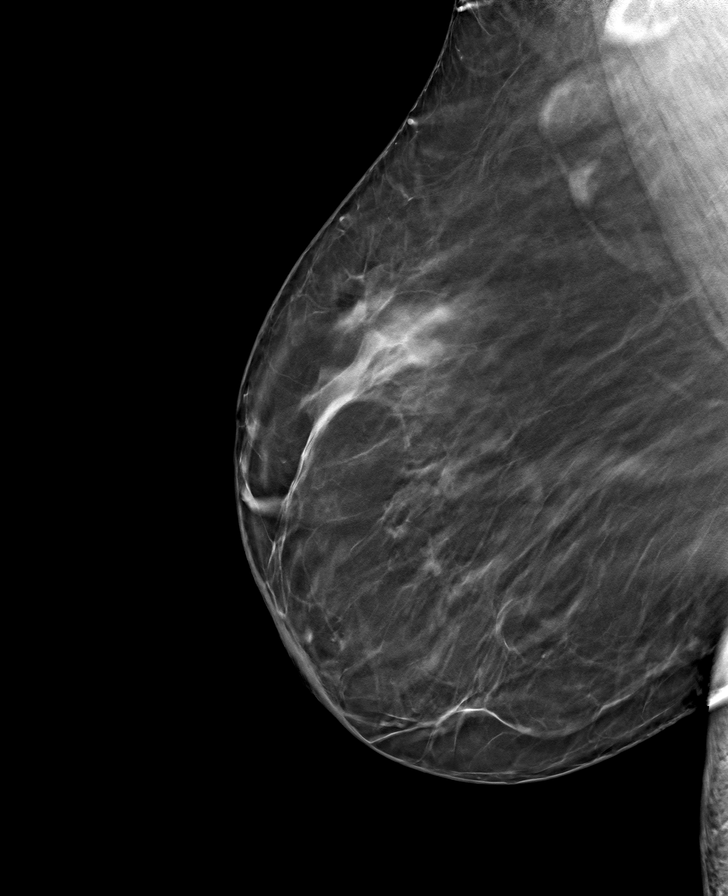

[R CC tomo · tomo slice 38/75.0]
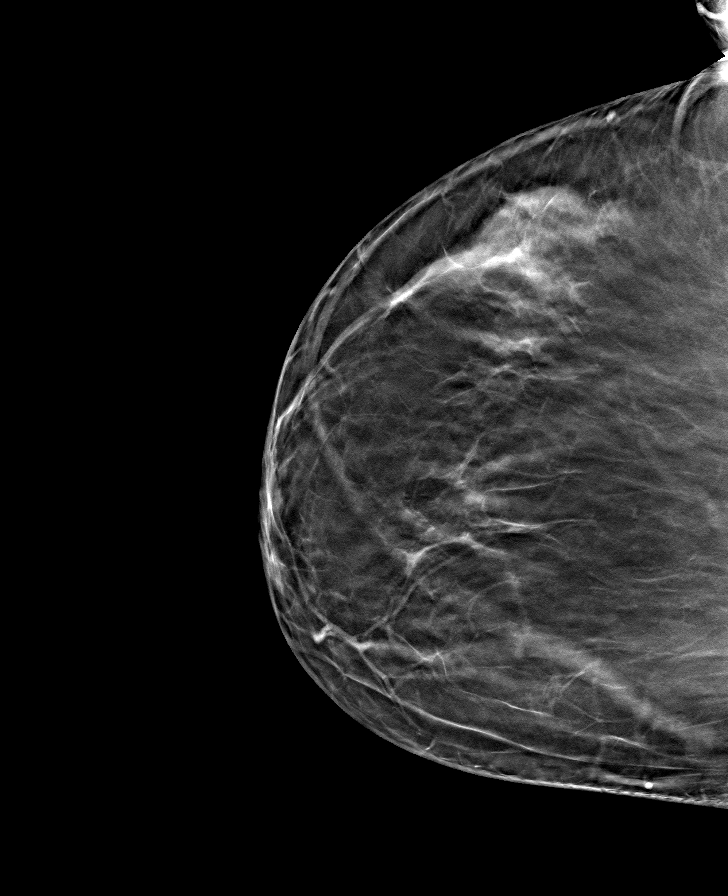

[8 of 24 positions shown; findings below may reference images not displayed]

ACR Breast Density Category b: There are scattered areas of
fibroglandular density.
FINDINGS: There are no findings suspicious for malignancy. Images were
processed with CAD.
IMPRESSION: No mammographic evidence of malignancy. A result letter of this
screening mammogram will be mailed directly to the patient.

RECOMMENDATION:
Screening mammogram in one year. (Code:Y5-G-EJ6)

BI-RADS CATEGORY  1: Negative.

## 2022-06-25 ENCOUNTER — Ambulatory Visit: Payer: 59 | Admitting: Podiatry

## 2022-06-25 ENCOUNTER — Encounter: Payer: Self-pay | Admitting: Podiatry

## 2022-06-25 ENCOUNTER — Ambulatory Visit (INDEPENDENT_AMBULATORY_CARE_PROVIDER_SITE_OTHER): Payer: 59

## 2022-06-25 ENCOUNTER — Telehealth: Payer: Self-pay

## 2022-06-25 DIAGNOSIS — M722 Plantar fascial fibromatosis: Secondary | ICD-10-CM | POA: Diagnosis not present

## 2022-06-25 DIAGNOSIS — M7672 Peroneal tendinitis, left leg: Secondary | ICD-10-CM | POA: Diagnosis not present

## 2022-06-25 DIAGNOSIS — M7671 Peroneal tendinitis, right leg: Secondary | ICD-10-CM | POA: Diagnosis not present

## 2022-06-25 MED ORDER — TRIAMCINOLONE ACETONIDE 10 MG/ML IJ SUSP
20.0000 mg | Freq: Once | INTRAMUSCULAR | Status: AC
  Administered 2022-06-25: 20 mg

## 2022-06-25 NOTE — Progress Notes (Signed)
Subjective:   Patient ID: Regina Mcdonald, female   DOB: 51 y.o.   MRN: [id number]   HPI Patient presents with a lot of discomfort in the outside of both heels does not remember specific injury but is now working 2 jobs and does experience moderate to significant obesity issues.  Patient has no tingling or numbness does not smoke and is very active   Review of Systems  All other systems reviewed and are negative.       Objective:  Physical Exam Vitals and nursing note reviewed.  Constitutional:      Appearance: She is well-developed.  Pulmonary:     Effort: Pulmonary effort is normal.  Musculoskeletal:        General: Normal range of motion.  Skin:    General: Skin is warm.  Neurological:     Mental Status: She is alert.     Neurovascular status intact muscle strength adequate range of motion within normal limits with patient noted to have inflammation pain of the peroneal tendon as it comes behind the medial malleolus bilateral with fluid buildup no indication of tendon dysfunction or muscle strength loss.  Good digital perfusion well oriented     Assessment:  Probability for peroneal strain bilateral with increased activity weightbearing and obesity is complicating factors      Plan:  H&P reviewed condition and advised on careful steroid injections explaining risk associated with this.  Patient at this point wants the procedure and I did sterile prep and injected the sheath of the peroneal behind the medial malleolus bilateral 3 mg Dexasone Kenalog 5 mg Xylocaine advised on reduced activity and placed on oral anti-inflammatory.  Reappoint if symptoms persist  X-rays indicate that there is spurring of the plantar and posterior heel region bilateral moderate flatfoot deformity no other pathology

## 2022-06-27 NOTE — Telephone Encounter (Signed)
Noted, thanks!

## 2022-07-05 ENCOUNTER — Ambulatory Visit: Payer: 59 | Admitting: Infectious Disease

## 2022-07-05 ENCOUNTER — Ambulatory Visit: Payer: 59

## 2022-07-09 ENCOUNTER — Ambulatory Visit: Payer: 59 | Admitting: Infectious Disease

## 2022-07-09 ENCOUNTER — Other Ambulatory Visit (HOSPITAL_COMMUNITY)
Admission: RE | Admit: 2022-07-09 | Discharge: 2022-07-09 | Disposition: A | Discharge status: Home / Self Care | Payer: 59 | Source: Ambulatory Visit | Attending: Infectious Disease | Admitting: Infectious Disease

## 2022-07-09 ENCOUNTER — Ambulatory Visit: Payer: 59

## 2022-07-09 ENCOUNTER — Other Ambulatory Visit (HOSPITAL_COMMUNITY): Payer: Self-pay

## 2022-07-09 ENCOUNTER — Other Ambulatory Visit: Payer: Self-pay

## 2022-07-09 VITALS — BP 120/76 | HR 77 | Resp 16 | Ht 62.0 in | Wt 282.0 lb

## 2022-07-09 DIAGNOSIS — E785 Hyperlipidemia, unspecified: Secondary | ICD-10-CM | POA: Diagnosis not present

## 2022-07-09 DIAGNOSIS — B20 Human immunodeficiency virus [HIV] disease: Secondary | ICD-10-CM

## 2022-07-09 DIAGNOSIS — E669 Obesity, unspecified: Secondary | ICD-10-CM

## 2022-07-09 DIAGNOSIS — Z113 Encounter for screening for infections with a predominantly sexual mode of transmission: Secondary | ICD-10-CM

## 2022-07-09 MED ORDER — PITAVASTATIN MAGNESIUM 4 MG PO TABS: 4.0000 mg | ORAL_TABLET | Freq: Every day | ORAL | 11 refills | Status: DC

## 2022-07-09 MED ORDER — BIKTARVY 50-200-25 MG PO TABS: 1.0000 | ORAL_TABLET | Freq: Every day | ORAL | 5 refills | Status: DC

## 2022-07-09 NOTE — Progress Notes (Signed)
Subjective:  Chief complaint: Follow-up for HIV disease on medications.  Patient ID: Regina Mcdonald, female    DOB: Jul 09, 1971, 51 y.o.   MRN: [id number]  HPI  51 year old African American woman with morbid obesity and HIV who has appeared to be a long term non-progressor. We had  Her previously on Atripla via START study   She then about of care for some time.  We tried to place her on Dovato but her insurance company would not cover it so we placed her on BIKTARVY which she is taking currently.  We discussed different antiretroviral regimens at prior visit and she was actually quite interested in [place].  I endeavored to get Cabenuva  for her yet but was not approved by her insurance  She would be a candidate for t he DO-IT study with [facility].  She is still however interested in [place].      Past Medical History:  Diagnosis Date   Depression 05/16/2020   Moodiness 07/18/2020   Obesity (BMI 30-39.9) 05/16/2020   Routine screening for STI (sexually transmitted infection) 09/27/2021    No past surgical history on file.  Family History  Problem Relation Age of Onset   Cancer Neg Hx    Hypertension Neg Hx    Diabetes Neg Hx       Social History   Socioeconomic History   Marital status: Widowed    Spouse name: Not on file   Number of children: Not on file   Years of education: Not on file   Highest education level: Not on file  Occupational History   Not on file  Tobacco Use   Smoking status: Never   Smokeless tobacco: Never  Vaping Use   Vaping Use: Never used  Substance and Sexual Activity   Alcohol use: No   Drug use: No   Sexual activity: Not Currently    Comment: declined condoms  Other Topics Concern   Not on file  Social History Narrative   Not on file   Social Determinants of Health   Financial Resource Strain: Not on file  Food Insecurity: Not on file  Transportation Needs: Not on file  Physical Activity: Not on file  Stress: Not on file   Social Connections: Not on file    No Known Allergies   Current Outpatient Medications:    bictegravir-emtricitabine-tenofovir AF (BIKTARVY) 50-200-25 MG TABS tablet, Take 1 tablet by mouth daily., Disp: 30 tablet, Rfl: 5   phentermine (ADIPEX-P) 37.5 MG tablet, Take by mouth., Disp: , Rfl:    Review of Systems  Constitutional:  Negative for activity change, appetite change, chills, diaphoresis, fatigue, fever and unexpected weight change.  HENT:  Negative for congestion, rhinorrhea, sinus pressure, sneezing, sore throat and trouble swallowing.   Eyes:  Negative for photophobia and visual disturbance.  Respiratory:  Negative for cough, chest tightness, shortness of breath, wheezing and stridor.   Cardiovascular:  Negative for chest pain, palpitations and leg swelling.  Gastrointestinal:  Negative for abdominal distention, abdominal pain, anal bleeding, blood in stool, constipation, diarrhea, nausea and vomiting.  Genitourinary:  Negative for difficulty urinating, dysuria, flank pain and hematuria.  Musculoskeletal:  Negative for arthralgias, back pain, gait problem, joint swelling and myalgias.  Skin:  Negative for color change, pallor, rash and wound.  Neurological:  Negative for dizziness, tremors, weakness and light-headedness.  Hematological:  Negative for adenopathy. Does not bruise/bleed easily.  Psychiatric/Behavioral:  Negative for agitation, behavioral problems, confusion, decreased concentration, dysphoric mood  and sleep disturbance.        Objective:   Physical Exam Constitutional:      General: She is not in acute distress.    Appearance: Normal appearance. She is well-developed. She is not ill-appearing or diaphoretic.  HENT:     Head: Normocephalic and atraumatic.     Right Ear: Hearing and external ear normal.     Left Ear: Hearing and external ear normal.     Nose: No nasal deformity or rhinorrhea.  Eyes:     General: No scleral icterus.    Conjunctiva/sclera:  Conjunctivae normal.     Right eye: Right conjunctiva is not injected.     Left eye: Left conjunctiva is not injected.     Pupils: Pupils are equal, round, and reactive to light.  Neck:     Vascular: No JVD.  Cardiovascular:     Rate and Rhythm: Normal rate and regular rhythm.     Heart sounds: S1 normal and S2 normal. No murmur heard. Pulmonary:     Effort: Pulmonary effort is normal. No respiratory distress.  Abdominal:     General: Bowel sounds are normal. There is no distension.     Palpations: Abdomen is soft.     Tenderness: There is no abdominal tenderness.  Musculoskeletal:        General: Normal range of motion.     Right shoulder: Normal.     Left shoulder: Normal.     Cervical back: Normal range of motion and neck supple.     Right hip: Normal.     Left hip: Normal.     Right knee: Normal.     Left knee: Normal.  Lymphadenopathy:     Head:     Right side of head: No submandibular, preauricular or posterior auricular adenopathy.     Left side of head: No submandibular, preauricular or posterior auricular adenopathy.     Cervical: No cervical adenopathy.     Right cervical: No superficial or deep cervical adenopathy.    Left cervical: No superficial or deep cervical adenopathy.  Skin:    General: Skin is warm and dry.     Coloration: Skin is not pale.     Findings: No abrasion, bruising, ecchymosis, erythema, lesion or rash.     Nails: There is no clubbing.  Neurological:     General: No focal deficit present.     Mental Status: She is alert and oriented to person, place, and time.     Sensory: No sensory deficit.     Coordination: Coordination normal.     Gait: Gait normal.  Psychiatric:        Attention and Perception: She is attentive.        Mood and Affect: Mood normal.        Speech: Speech normal.        Behavior: Behavior normal. Behavior is cooperative.        Thought Content: Thought content normal.        Judgment: Judgment normal.            Assessment & Plan:  HIV disease:  I am rechecking a viral load CD4 count CBC and CMP  Lab Results  Component Value Date   HIV1RNAQUANT Not Detected 09/27/2021    Lab Results  Component Value Date   CD4TABS 878 09/27/2021   CD4TABS 799 05/16/2020   CD4TABS 750 03/01/2020    We will continue her BIKTARVY prescription but we will look into  Renaldo Harrison (not covered on initial pass through new insurance but may with prior authorization  IF she cannot go onto [place] she is interested in DO IT study  Insomnia; she has noticed when taking Biktarvy later at night.  Will ask her to take it earlier in the evening or the day  Obesity: she is working on weight loss. She would consider DO IT study  CV prevention/hyperlipidemia:  Discussed REPRIEVE study and will initiate pitavastatin or formulary alternative.  STI screening: will check GC chlamydia in urine and RPR  STI screening screen for chlamydia and gonorrhea as well as syphilis

## 2022-07-10 LAB — T-HELPER CELLS (CD4) COUNT (NOT AT ARMC)
CD4 % Helper T Cell: 53 % (ref 33–65)
CD4 T Cell Abs: 748 /uL (ref 400–1790)

## 2022-07-11 ENCOUNTER — Ambulatory Visit: Payer: 59 | Admitting: Podiatry

## 2022-07-11 ENCOUNTER — Encounter: Payer: Self-pay | Admitting: Podiatry

## 2022-07-11 DIAGNOSIS — M7672 Peroneal tendinitis, left leg: Secondary | ICD-10-CM

## 2022-07-11 DIAGNOSIS — M722 Plantar fascial fibromatosis: Secondary | ICD-10-CM

## 2022-07-11 LAB — URINE CYTOLOGY ANCILLARY ONLY
Chlamydia: NEGATIVE
Comment: NEGATIVE
Comment: NORMAL
Neisseria Gonorrhea: NEGATIVE

## 2022-07-11 MED ORDER — DICLOFENAC SODIUM 75 MG PO TBEC: 75.0000 mg | DELAYED_RELEASE_TABLET | Freq: Two times a day (BID) | ORAL | 2 refills | Status: AC

## 2022-07-12 LAB — CBC WITH DIFFERENTIAL/PLATELET
Absolute Monocytes: 476 cells/uL (ref 200–950)
Basophils Absolute: 21 cells/uL (ref 0–200)
Basophils Relative: 0.3 %
Eosinophils Absolute: 42 cells/uL (ref 15–500)
Eosinophils Relative: 0.6 %
HCT: 36.3 % (ref 35.0–45.0)
Hemoglobin: 11.8 g/dL (ref 11.7–15.5)
Lymphs Abs: 1386 cells/uL (ref 850–3900)
MCH: 30.2 pg (ref 27.0–33.0)
MCHC: 32.5 g/dL (ref 32.0–36.0)
MCV: 92.8 fL (ref 80.0–100.0)
MPV: 10.2 fL (ref 7.5–12.5)
Monocytes Relative: 6.8 %
Neutro Abs: 5075 cells/uL (ref 1500–7800)
Neutrophils Relative %: 72.5 %
Platelets: 297 10*3/uL (ref 140–400)
RBC: 3.91 10*6/uL (ref 3.80–5.10)
RDW: 12.6 % (ref 11.0–15.0)
Total Lymphocyte: 19.8 %
WBC: 7 10*3/uL (ref 3.8–10.8)

## 2022-07-12 LAB — LIPID PANEL
Cholesterol: 136 mg/dL (ref <200)
HDL: 66 mg/dL (ref >=50)
LDL Cholesterol (Calc): 54 mg/dL (calc)
Non-HDL Cholesterol (Calc): 70 mg/dL (calc) (ref <130)
Total CHOL/HDL Ratio: 2.1 (calc) (ref <5.0)
Triglycerides: 78 mg/dL (ref <150)

## 2022-07-12 LAB — HIV-1 RNA QUANT-NO REFLEX-BLD
HIV 1 RNA Quant: NOT DETECTED Copies/mL
HIV-1 RNA Quant, Log: NOT DETECTED Log cps/mL

## 2022-07-12 LAB — COMPLETE METABOLIC PANEL WITH GFR
AG Ratio: 1.8 (calc) (ref 1.0–2.5)
ALT: 13 U/L (ref 6–29)
AST: 17 U/L (ref 10–35)
Albumin: 4.4 g/dL (ref 3.6–5.1)
Alkaline phosphatase (APISO): 53 U/L (ref 37–153)
BUN: 15 mg/dL (ref 7–25)
CO2: 29 mmol/L (ref 20–32)
Calcium: 9.5 mg/dL (ref 8.6–10.4)
Chloride: 108 mmol/L (ref 98–110)
Creat: 0.75 mg/dL (ref 0.50–1.03)
Globulin: 2.5 g/dL (calc) (ref 1.9–3.7)
Glucose, Bld: 94 mg/dL (ref 65–99)
Potassium: 4.1 mmol/L (ref 3.5–5.3)
Sodium: 144 mmol/L (ref 135–146)
Total Bilirubin: 0.4 mg/dL (ref 0.2–1.2)
Total Protein: 6.9 g/dL (ref 6.1–8.1)
eGFR: 96 mL/min/{1.73_m2} (ref >=60)

## 2022-07-12 LAB — RPR: RPR Ser Ql: NONREACTIVE

## 2022-07-12 NOTE — Progress Notes (Signed)
Subjective:   Patient ID: Regina Mcdonald, female   DOB: 51 y.o.   MRN: [id number]   HPI Patient states improved but still having a lot of pain in the heel especially when she gets up in the morning and after periods of sitting   ROS      Objective:  Physical Exam  Neurovascular status intact with inflammation pain still in the plantar fascia left at insertion with pain especially worse after periods of inactivity or when waking up in the morning     Assessment:  Acute plantar fasciitis left inflammation still present at insertion     Plan:  We reviewed condition and recommended night splint in order to properly support the arch and take pressure off the plantar fascia.  Patient is dispensed a night splint with all instructions on usage properly fitted to her lower leg I went ahead today and did do sterile prep reinjected the fascia 3 mg Kenalog 5 mg Xylocaine.  Reappoint to recheck 4 weeks

## 2022-07-14 ENCOUNTER — Other Ambulatory Visit: Payer: Self-pay | Admitting: Infectious Disease

## 2022-07-16 ENCOUNTER — Other Ambulatory Visit: Payer: Self-pay | Admitting: Infectious Disease

## 2022-07-16 ENCOUNTER — Other Ambulatory Visit (HOSPITAL_COMMUNITY): Payer: Self-pay

## 2022-07-16 MED ORDER — ATORVASTATIN CALCIUM 20 MG PO TABS: 20.0000 mg | ORAL_TABLET | Freq: Every day | ORAL | 11 refills | Status: DC

## 2022-07-16 NOTE — Telephone Encounter (Signed)
Lipitor $0.00 , Crestor $10.00

## 2022-07-16 NOTE — Telephone Encounter (Signed)
Can you send in alternative please? Thanks!

## 2022-10-15 ENCOUNTER — Other Ambulatory Visit: Payer: Self-pay | Admitting: Podiatry

## 2022-10-15 NOTE — Telephone Encounter (Signed)
She needs to be seen back if still having pain

## 2022-10-17 ENCOUNTER — Ambulatory Visit: Payer: 59 | Admitting: Infectious Disease

## 2022-10-18 ENCOUNTER — Telehealth: Payer: Self-pay

## 2022-10-18 NOTE — Telephone Encounter (Signed)
Called patient to reschedule missed visit on 10/17/22. No answer. Staff left patient a HIPAA compliant voicemail to return [facility] office call.  Valarie Cones, LPN

## 2022-10-31 ENCOUNTER — Other Ambulatory Visit: Payer: Self-pay

## 2022-10-31 ENCOUNTER — Ambulatory Visit: Payer: 59 | Admitting: Infectious Diseases

## 2022-10-31 ENCOUNTER — Encounter: Payer: Self-pay | Admitting: Infectious Diseases

## 2022-10-31 VITALS — BP 148/77 | HR 98 | Temp 98.5°F | Ht 62.0 in | Wt 295.0 lb

## 2022-10-31 DIAGNOSIS — R7303 Prediabetes: Secondary | ICD-10-CM

## 2022-10-31 DIAGNOSIS — B20 Human immunodeficiency virus [HIV] disease: Secondary | ICD-10-CM | POA: Diagnosis not present

## 2022-10-31 DIAGNOSIS — Z6841 Body Mass Index (BMI) 40.0 and over, adult: Secondary | ICD-10-CM

## 2022-10-31 MED ORDER — BUPROPION HCL ER (XL) 150 MG PO TB24: 150.0000 mg | ORAL_TABLET | Freq: Every day | ORAL | 2 refills | Status: AC

## 2022-10-31 NOTE — Progress Notes (Signed)
Name: Regina Mcdonald  DOB: June 19, 1971 MRN: [id number] PCP: Patient, No Pcp Per    Subjective:   Chief Complaint  Patient presents with   Follow-up    C/o weight gain       HPI: Regina Mcdonald is here today for routine follow up care regarding HIV. She regularly follows with Dr. Daiva Eves. Has been on [facility] for treatment with excellent results - most recently her VL was undetectable and CD4 was 748; has been this way for years.  Her main concern is the possible side effect of weight gain from the [place].  She mentions that she has had an increase in her weight gain and especially difficulty to lose it since being on the [place].  Also feels like she has some fatigue from it.  She did lose 40 pounds last year while taking Biktarvy but has regained it since.  She is not sure if her mood has anything to do with it but it is depressing thinking about the challenges to try to get some weight off.  We spent more time and more specific discussion over this. She cannot recall when she last had a menstrual cycle. Maybe some spotting in May or so of this year.       10/31/2022    4:04 PM  Depression screen PHQ 2/9  Decreased Interest 0  Down, Depressed, Hopeless 0  PHQ - 2 Score 0    Review of Systems  Constitutional:  Negative for chills, fever, malaise/fatigue and weight loss.  HENT:  Negative for sore throat.   Respiratory:  Negative for cough, sputum production and shortness of breath.   Cardiovascular: Negative.  Negative for chest pain.  Gastrointestinal:  Negative for abdominal pain, diarrhea and vomiting.  Musculoskeletal:  Negative for joint pain, myalgias and neck pain.  Skin:  Negative for rash.  Neurological:  Negative for headaches.  Psychiatric/Behavioral:  Negative for depression and substance abuse. The patient is not nervous/anxious.      Past Medical History:  Diagnosis Date   Depression 05/16/2020   Moodiness 07/18/2020   Obesity (BMI 30-39.9) 05/16/2020   Routine  screening for STI (sexually transmitted infection) 09/27/2021    Outpatient Medications Prior to Visit  Medication Sig Dispense Refill   bictegravir-emtricitabine-tenofovir AF (BIKTARVY) 50-200-25 MG TABS tablet Take 1 tablet by mouth daily. 30 tablet 5   diclofenac (VOLTAREN) 75 MG EC tablet Take 1 tablet (75 mg total) by mouth 2 (two) times daily. 50 tablet 2   atorvastatin (LIPITOR) 20 MG tablet Take 1 tablet (20 mg total) by mouth daily. (Patient not taking: Reported on 10/31/2022) 30 tablet 11   phentermine (ADIPEX-P) 37.5 MG tablet Take by mouth. (Patient not taking: Reported on 10/31/2022)     No facility-administered medications prior to visit.     No Known Allergies  Social History   Tobacco Use   Smoking status: Never   Smokeless tobacco: Never  Vaping Use   Vaping Use: Never used  Substance Use Topics   Alcohol use: No   Drug use: No    Family History  Problem Relation Age of Onset   Cancer Neg Hx    Hypertension Neg Hx    Diabetes Neg Hx     Social History   Substance and Sexual Activity  Sexual Activity Not Currently   Comment: declined condoms     Objective:   Vitals:   10/31/22 1559  BP: (!) 148/77  Pulse: 98  Temp: 98.5 F (36.9 C)  TempSrc: Oral  SpO2: 94%  Weight: 295 lb (133.8 kg)  Height: 5\' 2"  (1.575 m)   Body mass index is 53.96 kg/m.  Physical Exam Vitals reviewed.  Constitutional:      Appearance: Normal appearance. She is not ill-appearing.  HENT:     Mouth/Throat:     Mouth: Mucous membranes are moist.     Pharynx: Oropharynx is clear.  Eyes:     General: No scleral icterus. Cardiovascular:     Rate and Rhythm: Normal rate.  Pulmonary:     Effort: Pulmonary effort is normal.  Neurological:     Mental Status: She is alert and oriented to person, place, and time.  Psychiatric:        Mood and Affect: Mood normal.        Thought Content: Thought content normal.     Lab Results Lab Results  Component Value Date    WBC 7.0 07/09/2022   HGB 11.8 07/09/2022   HCT 36.3 07/09/2022   MCV 92.8 07/09/2022   PLT 297 07/09/2022    Lab Results  Component Value Date   CREATININE 0.75 07/09/2022   BUN 15 07/09/2022   NA 144 07/09/2022   K 4.1 07/09/2022   CL 108 07/09/2022   CO2 29 07/09/2022    Lab Results  Component Value Date   ALT 13 07/09/2022   AST 17 07/09/2022   ALKPHOS 44 12/29/2021   BILITOT 0.4 07/09/2022    Lab Results  Component Value Date   CHOL 136 07/09/2022   HDL 66 07/09/2022   LDLCALC 54 07/09/2022   TRIG 78 07/09/2022   CHOLHDL 2.1 07/09/2022   HIV 1 RNA Quant (Copies/mL)  Date Value  10/31/2022 Not Detected  07/09/2022 Not Detected  09/27/2021 Not Detected   CD4 (no units)  Date Value  10/22/2016 872  06/04/2016 838  12/08/2015 831   CD4 T Cell Abs (/uL)  Date Value  07/09/2022 748  09/27/2021 878  05/16/2020 799     Assessment & Plan:   Problem List Items Addressed This Visit       Unprioritized   BMI 50.0-59.9, adult (HCC)    Most of our visit was spent in discussion regarding weight management.  Fable has had a hard time maintaining and achieving weight loss notably over the last 2 years.  She has been on Biktarvy longer than this with and achieved 40 pound weight loss last year. While there is certainly association with increased weight gain on the integrates based regimens like Biktarvy, we discussed it may not be the only part of the problem for her.  She does also report decreased menstruation and in perimenopausal state.  Unfortunately this also makes achieving weight loss quite challenging.  We talked about switching to an alternative regimen today like Delstrigo or more preferably with her age Pifeltro plus Descovy.  We also started discussion around looking at other factors as well to support her.  I think she would be a good candidate to initiate some Wellbutrin once a day.  This may very well help her fatigue and have the added benefit of appetite  suppression.  She would like to try this to see if that helps.  Will also screen for any prediabetes or diabetes with hemoglobin A1c.  She has a follow-up appointment with her primary care doctor soon.  If she has prediabetes or is found to be diabetic she would most certainly benefit from GLP-1/GIP medications both for glycemic regulation and for  obesity management.  For now she will continue the [place] daily and try the Wellbutrin and plan to discuss with her primary care team further.      HIV disease (HCC)    Very well controlled on once daily Biktarvy. No concerns with access or adherence to medication.  Aside from the concern of tensional weight impact she is tolerating the medication well without side effects. No drug interactions identified. Pertinent lab tests ordered today.  No changes to insurance coverage.  No dental needs today.  History of depression and potentially dysphoric mood currently.  Wellbutrin to start. Sexual health and family planning discussed -it sounds like she is approaching menopause with last normal menstrual period within the last 12 months.  These are becoming more erratic and lighter..   Return in about 3 months (around 01/30/2023).        Relevant Orders   HIV 1 RNA quant-no reflex-bld (Completed)   Prediabetes    Recheck hemoglobin A1c today      Other Visit Diagnoses     Class 3 severe obesity without serious comorbidity in adult, unspecified BMI, unspecified obesity type (HCC)    -  Primary   Relevant Orders   Hemoglobin A1c (Completed)      Rexene Alberts, MSN, NP-C [facility] Pager: [phone] Office: [phone]  11/09/22  1:33 PM

## 2022-10-31 NOTE — Patient Instructions (Addendum)
Wellbutrin for help with increase energy and hopefully some weight loss One tablet once a day - can play around with the timing but I would start in the AM to see how that helps since it can be a bit more stimulating.   If you tolerate it well, I would try it for 2-3 months to see if this helps at all. Will also let you know what your test results show today.   Two Books I would recommend:   How to Lose Weight for the Last Time - Katrina Ubell   The Obesity Code - Wylene Simmer

## 2022-11-02 ENCOUNTER — Emergency Department (HOSPITAL_BASED_OUTPATIENT_CLINIC_OR_DEPARTMENT_OTHER)
Admission: EM | Admit: 2022-11-02 | Discharge: 2022-11-02 | Disposition: A | Discharge status: Home / Self Care | Payer: 59 | Attending: Emergency Medicine | Admitting: Emergency Medicine

## 2022-11-02 ENCOUNTER — Encounter (HOSPITAL_BASED_OUTPATIENT_CLINIC_OR_DEPARTMENT_OTHER): Payer: Self-pay | Admitting: Emergency Medicine

## 2022-11-02 ENCOUNTER — Other Ambulatory Visit: Payer: Self-pay

## 2022-11-02 DIAGNOSIS — Z1152 Encounter for screening for COVID-19: Secondary | ICD-10-CM | POA: Diagnosis not present

## 2022-11-02 DIAGNOSIS — J0100 Acute maxillary sinusitis, unspecified: Secondary | ICD-10-CM

## 2022-11-02 DIAGNOSIS — R509 Fever, unspecified: Secondary | ICD-10-CM | POA: Diagnosis present

## 2022-11-02 DIAGNOSIS — B349 Viral infection, unspecified: Secondary | ICD-10-CM | POA: Diagnosis not present

## 2022-11-02 DIAGNOSIS — J069 Acute upper respiratory infection, unspecified: Secondary | ICD-10-CM

## 2022-11-02 LAB — RESP PANEL BY RT-PCR (RSV, FLU A&B, COVID)  RVPGX2
Influenza A by PCR: NEGATIVE
Influenza B by PCR: NEGATIVE
Resp Syncytial Virus by PCR: NEGATIVE
SARS Coronavirus 2 by RT PCR: NEGATIVE

## 2022-11-02 MED ORDER — AMOXICILLIN-POT CLAVULANATE 875-125 MG PO TABS: 1.0000 | ORAL_TABLET | Freq: Two times a day (BID) | ORAL | 0 refills | Status: DC

## 2022-11-02 MED ORDER — ONDANSETRON 4 MG PO TBDP: ORAL_TABLET | ORAL | 0 refills | Status: AC

## 2022-11-02 MED ORDER — BENZONATATE 100 MG PO CAPS: 100.0000 mg | ORAL_CAPSULE | Freq: Three times a day (TID) | ORAL | 0 refills | Status: DC

## 2022-11-02 MED ORDER — AMOXICILLIN-POT CLAVULANATE 875-125 MG PO TABS
1.0000 | ORAL_TABLET | Freq: Once | ORAL | Status: AC
  Administered 2022-11-02: 1 via ORAL
  Filled 2022-11-02: qty 1

## 2022-11-02 NOTE — ED Triage Notes (Signed)
Pt is c/o flu like sxs for about a week  Pt has been taking over the counter medication  States she cannot break her fever  Pt is c/o sore throat and body aches

## 2022-11-02 NOTE — Discharge Instructions (Signed)
Take tylenol 2 pills 4 times a day and motrin 4 pills 3 times a day.  Drink plenty of fluids.  Return for worsening shortness of breath, headache, confusion. Follow up with your family doctor.   

## 2022-11-02 NOTE — ED Provider Notes (Signed)
[facility] EMERGENCY DEPARTMENT Provider Note   CSN: [id number] Arrival date & time: 11/02/22  6945     History  Chief Complaint  Patient presents with   Fever    Regina Mcdonald is a 51 y.o. female.  51 yo F with a cc of cough congestion fever chills myalgias.  Going on for the past week or so.  Having some sinus congestion and facial pain.  Was doing better and then started having fevers about 48 hours ago.  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    Fever      Home Medications Prior to Admission medications   Medication Sig Start Date End Date Taking? Authorizing Provider  amoxicillin-clavulanate (AUGMENTIN) 875-125 MG tablet Take 1 tablet by mouth every 12 (twelve) hours. 11/02/22  Yes Melene Plan, DO  benzonatate (TESSALON) 100 MG capsule Take 1 capsule (100 mg total) by mouth every 8 (eight) hours. 11/02/22  Yes Melene Plan, DO  ondansetron (ZOFRAN-ODT) 4 MG disintegrating tablet 4mg  ODT q4 hours prn nausea/vomit 11/02/22  Yes 11/04/22, DO  atorvastatin (LIPITOR) 20 MG tablet Take 1 tablet (20 mg total) by mouth daily. Patient not taking: Reported on 10/31/2022 07/16/22   07/18/22, Daiva Eves, MD  bictegravir-emtricitabine-tenofovir AF (BIKTARVY) 50-200-25 MG TABS tablet Take 1 tablet by mouth daily. 07/09/22   07/11/22, MD  buPROPion (WELLBUTRIN XL) 150 MG 24 hr tablet Take 1 tablet (150 mg total) by mouth daily. 10/31/22   11/02/22, NP  diclofenac (VOLTAREN) 75 MG EC tablet Take 1 tablet (75 mg total) by mouth 2 (two) times daily. 07/11/22   07/13/22, DPM  phentermine (ADIPEX-P) 37.5 MG tablet Take by mouth. Patient not taking: Reported on 10/31/2022 09/19/21   [provider]      Allergies    Patient has no known allergies.    Review of Systems   Review of Systems  Constitutional:  Positive for fever.    Physical Exam Updated Vital Signs BP (!) 157/76 (BP Location: Right Arm)   Pulse 88   Temp 99.5 F (37.5 C)  (Oral)   Resp 20   Ht 5\' 2"  (1.575 m)   Wt 133.8 kg   SpO2 97%   BMI 53.96 kg/m  Physical Exam Vitals and nursing note reviewed.  Constitutional:      General: She is not in acute distress.    Appearance: She is well-developed. She is not diaphoretic.  HENT:     Head: Normocephalic and atraumatic.     Comments: Swollen turbinates, posterior nasal drip, R TM with purulent effusion, no bulging.  R maxillary sinus tender to percussion  Eyes:     Pupils: Pupils are equal, round, and reactive to light.  Cardiovascular:     Rate and Rhythm: Normal rate and regular rhythm.     Heart sounds: No murmur heard.    No friction rub. No gallop.  Pulmonary:     Effort: Pulmonary effort is normal.     Breath sounds: No wheezing or rales.  Abdominal:     General: There is no distension.     Palpations: Abdomen is soft.     Tenderness: There is no abdominal tenderness.  Musculoskeletal:        General: No tenderness.     Cervical back: Normal range of motion and neck supple.  Skin:    General: Skin is warm and dry.  Neurological:     Mental Status: She is alert  and oriented to person, place, and time.  Psychiatric:        Behavior: Behavior normal.     ED Results / Procedures / Treatments   Labs (all labs ordered are listed, but only abnormal results are displayed) Labs Reviewed  RESP PANEL BY RT-PCR (RSV, FLU A&B, COVID)  [id number]    EKG None  Radiology No results found.  Procedures Procedures    Medications Ordered in ED Medications  amoxicillin-clavulanate (AUGMENTIN) 875-125 MG per tablet 1 tablet (1 tablet Oral Given 11/02/22 0630)    ED Course/ Medical Decision Making/ A&P                           Medical Decision Making Risk Prescription drug management.   51 yo F with a significant PMH of HIV here with URI symptoms for a few days then developed fevers and facial pain.  Will treat with abx for possible sinusitis.  PCP follow up.  6:31 AM:  I have discussed  the diagnosis/risks/treatment options with the patient.  Evaluation and diagnostic testing in the emergency department does not suggest an emergent condition requiring admission or immediate intervention beyond what has been performed at this time.  They will follow up with PCP. We also discussed returning to the ED immediately if new or worsening sx occur. We discussed the sx which are most concerning (e.g., sudden worsening pain,  inability to tolerate by mouth) that necessitate immediate return. Medications administered to the patient during their visit and any new prescriptions provided to the patient are listed below.  Medications given during this visit Medications  amoxicillin-clavulanate (AUGMENTIN) 875-125 MG per tablet 1 tablet (1 tablet Oral Given 11/02/22 0630)     The patient appears reasonably screen and/or stabilized for discharge and I doubt any other medical condition or other [facility] requiring further screening, evaluation, or treatment in the ED at this time prior to discharge.          Final Clinical Impression(s) / ED Diagnoses Final diagnoses:  Viral upper respiratory tract infection  Acute maxillary sinusitis, recurrence not specified    Rx / DC Orders ED Discharge Orders          Ordered    amoxicillin-clavulanate (AUGMENTIN) 875-125 MG tablet  Every 12 hours        11/02/22 0627    ondansetron (ZOFRAN-ODT) 4 MG disintegrating tablet        11/02/22 0627    benzonatate (TESSALON) 100 MG capsule  Every 8 hours        11/02/22 0627              Melene Plan, DO 11/02/22 [phone]

## 2022-11-03 LAB — HEMOGLOBIN A1C
Hgb A1c MFr Bld: 6.1 % of total Hgb — ABNORMAL HIGH (ref <5.7)
Mean Plasma Glucose: 128 mg/dL
eAG (mmol/L): 7.1 mmol/L

## 2022-11-03 LAB — HIV-1 RNA QUANT-NO REFLEX-BLD
HIV 1 RNA Quant: NOT DETECTED Copies/mL
HIV-1 RNA Quant, Log: NOT DETECTED Log cps/mL

## 2022-11-07 ENCOUNTER — Telehealth: Payer: Self-pay

## 2022-11-07 NOTE — Telephone Encounter (Signed)
-----   Message from Blanchard Kelch, NP sent at 11/07/2022  9:06 AM EST ----- Please call Regina Mcdonald to let her know that her viral load remains undetectable - great! She does have a little worsening of her pre-diabetes condition with A1C 6.1% (this shows the average blood sugar reading over 3 months). Would like to get it < 5.7%  I think she may very well benefit from discussions with her PCP about some of the other options to help improve her blood sugar and help with weight loss, specifically Ozempic or Wegovy if they can help her get it.  I am hopeful they can help her get what she needs to avoid progression to diabetes. They would be fine to take with the Wellbutrin as well if that is favorable for her too.   ~Stephanie

## 2022-11-07 NOTE — Telephone Encounter (Signed)
Left voicemail asking patient to return my call.   Deyona Soza P Evalena Fujii, CMA  

## 2022-11-07 NOTE — Progress Notes (Signed)
Please call Regina Mcdonald to let her know that her viral load remains undetectable - great! She does have a little worsening of her pre-diabetes condition with A1C 6.1% (this shows the average blood sugar reading over 3 months). Would like to get it < 5.7%  I think she may very well benefit from discussions with her PCP about some of the other options to help improve her blood sugar and help with weight loss, specifically Ozempic or Wegovy if they can help her get it.  I am hopeful they can help her get what she needs to avoid progression to diabetes. They would be fine to take with the Wellbutrin as well if that is favorable for her too.   ~Starr Urias

## 2022-11-08 NOTE — Telephone Encounter (Signed)
Patient informed of lab results and verbalized understanding. Patient will be working on finding a new pcp after her insurance switches the beginning of the year

## 2022-11-09 NOTE — Assessment & Plan Note (Signed)
Very well controlled on once daily Biktarvy. No concerns with access or adherence to medication.  Aside from the concern of tensional weight impact she is tolerating the medication well without side effects. No drug interactions identified. Pertinent lab tests ordered today.  No changes to insurance coverage.  No dental needs today.  History of depression and potentially dysphoric mood currently.  Wellbutrin to start. Sexual health and family planning discussed -it sounds like she is approaching menopause with last normal menstrual period within the last 12 months.  These are becoming more erratic and lighter..   Return in about 3 months (around 01/30/2023).

## 2022-11-09 NOTE — Assessment & Plan Note (Signed)
Recheck hemoglobin A1c today

## 2022-11-09 NOTE — Assessment & Plan Note (Addendum)
Most of our visit was spent in discussion regarding weight management.  Evola has had a hard time maintaining and achieving weight loss notably over the last 2 years.  She has been on Biktarvy longer than this with and achieved 40 pound weight loss last year. While there is certainly association with increased weight gain on the integrates based regimens like Biktarvy, we discussed it may not be the only part of the problem for her.  She does also report decreased menstruation and in perimenopausal state.  Unfortunately this also makes achieving weight loss quite challenging.  We talked about switching to an alternative regimen today like Delstrigo or more preferably with her age Pifeltro plus Descovy.  We also started discussion around looking at other factors as well to support her.  I think she would be a good candidate to initiate some Wellbutrin once a day.  This may very well help her fatigue and have the added benefit of appetite suppression.  She would like to try this to see if that helps.  Will also screen for any prediabetes or diabetes with hemoglobin A1c.  She has a follow-up appointment with her primary care doctor soon.  If she has prediabetes or is found to be diabetic she would most certainly benefit from GLP-1/GIP medications both for glycemic regulation and for obesity management.  For now she will continue the [place] daily and try the Wellbutrin and plan to discuss with her primary care team further.

## 2022-11-27 ENCOUNTER — Other Ambulatory Visit: Payer: Self-pay | Admitting: Infectious Diseases

## 2022-12-03 ENCOUNTER — Ambulatory Visit: Payer: 59 | Admitting: Infectious Disease

## 2022-12-20 ENCOUNTER — Other Ambulatory Visit (HOSPITAL_COMMUNITY): Payer: Self-pay

## 2023-02-14 ENCOUNTER — Ambulatory Visit: Payer: Commercial Managed Care - HMO | Admitting: Podiatry

## 2023-02-14 ENCOUNTER — Encounter: Payer: Self-pay | Admitting: Podiatry

## 2023-02-14 DIAGNOSIS — M7661 Achilles tendinitis, right leg: Secondary | ICD-10-CM

## 2023-02-14 DIAGNOSIS — M722 Plantar fascial fibromatosis: Secondary | ICD-10-CM | POA: Diagnosis not present

## 2023-02-14 DIAGNOSIS — M7662 Achilles tendinitis, left leg: Secondary | ICD-10-CM

## 2023-02-14 MED ORDER — MELOXICAM 15 MG PO TABS: 15.0000 mg | ORAL_TABLET | Freq: Every day | ORAL | 1 refills | Status: AC

## 2023-02-14 MED ORDER — TRIAMCINOLONE ACETONIDE 10 MG/ML IJ SUSP
20.0000 mg | Freq: Once | INTRAMUSCULAR | Status: AC
  Administered 2023-02-14: 20 mg

## 2023-02-14 NOTE — Progress Notes (Signed)
Subjective:   Patient ID: Regina Mcdonald, female   DOB: 52 y.o.   MRN: [id number]   HPI Patient states she is getting pain on the back and the bottom of the heels thinking the bottom probably started first on the back she is have obesity which is complicating factor in the outside to the feet are doing excellent   ROS      Objective:  Physical Exam  Acute inflammation plantar heel bilateral and moderately into the posterior heel     Assessment:  Plantar fasciitis bilateral inflammation fluid buildup and moderate Achilles tendinitis bilateral which is probably compensatory     Plan:  H&P reviewed I went ahead today did sterile prep injected the plantar fascia bilateral 3 mg Kenalog 5 mg Xylocaine to reduce inflammation advised on stretching exercises writing out sheets for her and placed on Mobic.  Reappoint as symptoms indicate

## 2023-02-14 NOTE — Addendum Note (Signed)
Addended by: Geni Bers on: 02/14/2023 04:55 PM   Modules accepted: Orders

## 2023-02-14 NOTE — Patient Instructions (Signed)

## 2023-04-20 ENCOUNTER — Other Ambulatory Visit: Payer: Self-pay | Admitting: Infectious Diseases

## 2023-04-22 NOTE — Telephone Encounter (Signed)
LVM requesting call back. Need to see if Kajal was able to find a PCP.   Sandie Ano, RN

## 2023-04-24 NOTE — Telephone Encounter (Signed)
Second attempt to reach Brynja, appears she has established with [facility]. Will have pharmacy route the request to them.   Sandie Ano, RN

## 2023-06-18 ENCOUNTER — Emergency Department (HOSPITAL_BASED_OUTPATIENT_CLINIC_OR_DEPARTMENT_OTHER)
Admission: EM | Admit: 2023-06-18 | Discharge: 2023-06-18 | Disposition: A | Discharge status: Home / Self Care | Payer: Commercial Managed Care - HMO | Attending: Emergency Medicine | Admitting: Emergency Medicine

## 2023-06-18 ENCOUNTER — Other Ambulatory Visit: Payer: Self-pay

## 2023-06-18 ENCOUNTER — Encounter (HOSPITAL_BASED_OUTPATIENT_CLINIC_OR_DEPARTMENT_OTHER): Payer: Self-pay

## 2023-06-18 DIAGNOSIS — U071 COVID-19: Secondary | ICD-10-CM | POA: Diagnosis not present

## 2023-06-18 DIAGNOSIS — R059 Cough, unspecified: Secondary | ICD-10-CM | POA: Diagnosis present

## 2023-06-18 LAB — RESP PANEL BY RT-PCR (RSV, FLU A&B, COVID)  RVPGX2
Influenza A by PCR: NEGATIVE
Influenza B by PCR: NEGATIVE
Resp Syncytial Virus by PCR: NEGATIVE
SARS Coronavirus 2 by RT PCR: POSITIVE — AB

## 2023-06-18 MED ORDER — ALBUTEROL SULFATE HFA 108 (90 BASE) MCG/ACT IN AERS: 2.0000 | INHALATION_SPRAY | Freq: Four times a day (QID) | RESPIRATORY_TRACT | 2 refills | Status: AC | PRN

## 2023-06-18 MED ORDER — NIRMATRELVIR/RITONAVIR (PAXLOVID)TABLET: 3.0000 | ORAL_TABLET | Freq: Two times a day (BID) | ORAL | 0 refills | Status: AC

## 2023-06-18 NOTE — ED Provider Notes (Signed)
[place] [facility] Provider Note   CSN: [id number] Arrival date & time: 06/18/23  1922     History  Chief Complaint  Patient presents with   flu-like symptoms    Regina Mcdonald is a 52 y.o. female.  Patient complains of a cough and congestion since Sunday.  2 days ago.  Patient reports that she was at a family gathering and became sick afterwards.  Patient complains of coughing.  Patient reports she has had bodyaches and fever.  Patient reports exposure to a child that coughed on her.  Patient complains of chest congestion today.  Patient complains of headache and bodyaches.  The history is provided by the patient. No language interpreter was used.       Home Medications Prior to Admission medications   Medication Sig Start Date End Date Taking? Authorizing Provider  albuterol (VENTOLIN HFA) 108 (90 Base) MCG/ACT inhaler Inhale 2 puffs into the lungs every 6 (six) hours as needed for wheezing or shortness of breath. 06/18/23  Yes Elson Areas, PA-C  nirmatrelvir/ritonavir (PAXLOVID) 20 x 150 MG & 10 x 100MG  TABS Take 3 tablets by mouth 2 (two) times daily for 5 days. Patient GFR is normal Take nirmatrelvir (150 mg) two tablets twice daily for 5 days and ritonavir (100 mg) one tablet twice daily for 5 days. 06/18/23 06/23/23 Yes Cheron Schaumann K, PA-C  amoxicillin-clavulanate (AUGMENTIN) 875-125 MG tablet Take 1 tablet by mouth every 12 (twelve) hours. 11/02/22   Melene Plan, DO  atorvastatin (LIPITOR) 20 MG tablet Take 1 tablet (20 mg total) by mouth daily. Patient not taking: Reported on 10/31/2022 07/16/22   Daiva Eves, Lisette Grinder, MD  benzonatate (TESSALON) 100 MG capsule Take 1 capsule (100 mg total) by mouth every 8 (eight) hours. 11/02/22   Melene Plan, DO  bictegravir-emtricitabine-tenofovir AF (BIKTARVY) 50-200-25 MG TABS tablet Take 1 tablet by mouth daily. 07/09/22   Randall Hiss, MD  buPROPion (WELLBUTRIN XL) 150 MG 24 hr tablet Take  1 tablet (150 mg total) by mouth daily. 10/31/22   Blanchard Kelch, NP  diclofenac (VOLTAREN) 75 MG EC tablet Take 1 tablet (75 mg total) by mouth 2 (two) times daily. 07/11/22   Lenn Sink, DPM  meloxicam (MOBIC) 15 MG tablet Take 1 tablet (15 mg total) by mouth daily. 02/14/23   Lenn Sink, DPM  ondansetron (ZOFRAN-ODT) 4 MG disintegrating tablet 4mg  ODT q4 hours prn nausea/vomit 11/02/22   Melene Plan, DO  phentermine (ADIPEX-P) 37.5 MG tablet Take by mouth. Patient not taking: Reported on 10/31/2022 09/19/21   [provider]      Allergies    Patient has no known allergies.    Review of Systems   Review of Systems  All other systems reviewed and are negative.   Physical Exam Updated Vital Signs BP (!) 189/59 (BP Location: Left Arm)   Pulse (!) 111   Temp 100.1 F (37.8 C) (Oral)   Resp 20   Ht 5\' 2"  (1.575 m)   Wt (!) 137 kg   LMP  (LMP Unknown)   SpO2 94%   BMI 55.24 kg/m  Physical Exam Vitals and nursing note reviewed.  Constitutional:      Appearance: She is well-developed.  HENT:     Head: Normocephalic.  Cardiovascular:     Rate and Rhythm: Normal rate.  Pulmonary:     Effort: Pulmonary effort is normal.     Comments: Rhonchi lower  lobes Abdominal:     General: There is no distension.  Musculoskeletal:        General: Normal range of motion.     Cervical back: Normal range of motion.  Skin:    General: Skin is warm.  Neurological:     General: No focal deficit present.     Mental Status: She is alert and oriented to person, place, and time.     ED Results / Procedures / Treatments   Labs (all labs ordered are listed, but only abnormal results are displayed) Labs Reviewed  RESP PANEL BY RT-PCR (RSV, FLU A&B, COVID)  RVPGX2 - Abnormal; Notable for the following components:      Result Value   SARS Coronavirus 2 by RT PCR POSITIVE (*)    All other components within normal limits    EKG None  Radiology No results  found.  Procedures Procedures    Medications Ordered in ED Medications - No data to display  ED Course/ Medical Decision Making/ A&P                                 Medical Decision Making Patient complains of a cough and congestion.  Patient has been sick since "Sunday.  Amount and/or Complexity of Data Reviewed Labs: ordered. Decision-making details documented in ED Course.    Details: Fit test is positive  Risk Prescription drug management. Risk Details: Patient counseled on results of COVID test.  Patient offered Paxlovid.  Patient has had normal GFR's in the past.  Patient is given a prescription for Paxil Oved she is also given a albuterol inhaler.  Patient is advised to return if any shortness of breath if symptoms worsen or changed.  Patient is encouraged to drink plenty of fluids and Tylenol for fever and bodyaches           Final Clinical Impression(s) / ED Diagnoses Final diagnoses:  COVID    Rx / DC Orders ED Discharge Orders          Ordered    nirmatrelvir/ritonavir (PAXLOVID) 20 x 150 MG & 10 x 100MG TABS  2 times daily        06/18/23 2046    albuterol (VENTOLIN HFA) 108 (90 Base) MCG/ACT inhaler  Every 6 hours PRN        07" /30/24 2046           An After Visit Summary was printed and given to the patient.    Osie Cheeks 06/18/23 2116    Lonell Grandchild, MD 06/19/23 1213

## 2023-06-18 NOTE — Discharge Instructions (Signed)
Return if any problems.  Tylenol for fever.  See your Physician for recheck in 1 week .

## 2023-06-18 NOTE — ED Triage Notes (Signed)
Reports flu/like symptoms since Sunday Cough, body aches +fever and chills

## 2023-06-22 ENCOUNTER — Other Ambulatory Visit: Payer: Self-pay

## 2023-06-22 ENCOUNTER — Emergency Department (HOSPITAL_BASED_OUTPATIENT_CLINIC_OR_DEPARTMENT_OTHER)
Admission: EM | Admit: 2023-06-22 | Discharge: 2023-06-22 | Disposition: A | Discharge status: Home / Self Care | Payer: Commercial Managed Care - HMO | Attending: Emergency Medicine | Admitting: Emergency Medicine

## 2023-06-22 ENCOUNTER — Emergency Department (HOSPITAL_BASED_OUTPATIENT_CLINIC_OR_DEPARTMENT_OTHER): Payer: Commercial Managed Care - HMO

## 2023-06-22 ENCOUNTER — Telehealth (HOSPITAL_BASED_OUTPATIENT_CLINIC_OR_DEPARTMENT_OTHER): Payer: Self-pay | Admitting: Emergency Medicine

## 2023-06-22 ENCOUNTER — Encounter (HOSPITAL_BASED_OUTPATIENT_CLINIC_OR_DEPARTMENT_OTHER): Payer: Self-pay | Admitting: Emergency Medicine

## 2023-06-22 DIAGNOSIS — R052 Subacute cough: Secondary | ICD-10-CM | POA: Insufficient documentation

## 2023-06-22 DIAGNOSIS — J45909 Unspecified asthma, uncomplicated: Secondary | ICD-10-CM | POA: Diagnosis not present

## 2023-06-22 MED ORDER — BENZONATATE 100 MG PO CAPS: 100.0000 mg | ORAL_CAPSULE | Freq: Three times a day (TID) | ORAL | 0 refills | Status: AC | PRN

## 2023-06-22 NOTE — Discharge Instructions (Signed)
As discussed, chest x-ray was without signs of pneumonia or other abnormality.  I suspect your symptoms are still likely to COVID.  Continue using your Paxlovid, DayQuil.  Recommend nasal steroid spray such as Nasacort/Flonase to help with your nasal congestion.  Recommend follow-up with primary care for reassessment of your symptoms.  Please do not hesitate to return to emergency department for worrisome signs or symptoms we discussed become apparent.

## 2023-06-22 NOTE — ED Provider Notes (Signed)
[place] [facility] Provider Note   CSN: [id number] Arrival date & time: 06/22/23  1342     History  Chief Complaint  Patient presents with   Cough    Regina Mcdonald is a 52 y.o. female.   Cough   52 year old female presents emergency department with complaints of cough runny nose.  Patient states that she has had symptoms for the past 5 or 6 days.  Was at the emergency department 3 to 4 days ago and tested positive for COVID and was prescribed Paxil at that time as well as albuterol.  Patient states he has a history of asthma and has noted some wheezing during duration of illness of which has been responding to albuterol in the outpatient setting.  She states that her symptoms have been improving.  She states she took an at home COVID test again today to see whether or not she was positive because she was wanting to go to church tomorrow.  The test resulted positive and she subsequently got on the phone with the nurse who told to come the emergency department for repeat evaluation given that she was still positive for COVID.  Patient states that her symptoms are improving.  Denies any fever, chills, chest pain, shortness of breath, abdominal pain, nausea, vomiting, urinary symptoms, change in bowel habits.  States she has been taking at home DayQuil in addition to medications listed above which has been helping her symptoms.  Past medical history significant for hypertension, prediabetes, asthma, obesity, HIV  Home Medications Prior to Admission medications   Medication Sig Start Date End Date Taking? Authorizing Provider  benzonatate (TESSALON) 100 MG capsule Take 1 capsule (100 mg total) by mouth 3 (three) times daily as needed. 06/22/23  Yes Sherian Maroon A, PA  albuterol (VENTOLIN HFA) 108 (90 Base) MCG/ACT inhaler Inhale 2 puffs into the lungs every 6 (six) hours as needed for wheezing or shortness of breath. 06/18/23   Elson Areas, PA-C   amoxicillin-clavulanate (AUGMENTIN) 875-125 MG tablet Take 1 tablet by mouth every 12 (twelve) hours. 11/02/22   Melene Plan, DO  atorvastatin (LIPITOR) 20 MG tablet Take 1 tablet (20 mg total) by mouth daily. Patient not taking: Reported on 10/31/2022 07/16/22   Daiva Eves, Lisette Grinder, MD  bictegravir-emtricitabine-tenofovir AF (BIKTARVY) 50-200-25 MG TABS tablet Take 1 tablet by mouth daily. 07/09/22   Randall Hiss, MD  buPROPion (WELLBUTRIN XL) 150 MG 24 hr tablet Take 1 tablet (150 mg total) by mouth daily. 10/31/22   Blanchard Kelch, NP  diclofenac (VOLTAREN) 75 MG EC tablet Take 1 tablet (75 mg total) by mouth 2 (two) times daily. 07/11/22   Lenn Sink, DPM  meloxicam (MOBIC) 15 MG tablet Take 1 tablet (15 mg total) by mouth daily. 02/14/23   Lenn Sink, DPM  nirmatrelvir/ritonavir (PAXLOVID) 20 x 150 MG & 10 x 100MG  TABS Take 3 tablets by mouth 2 (two) times daily for 5 days. Patient GFR is normal Take nirmatrelvir (150 mg) two tablets twice daily for 5 days and ritonavir (100 mg) one tablet twice daily for 5 days. 06/18/23 06/23/23  Elson Areas, PA-C  ondansetron (ZOFRAN-ODT) 4 MG disintegrating tablet 4mg  ODT q4 hours prn nausea/vomit 11/02/22   Melene Plan, DO  phentermine (ADIPEX-P) 37.5 MG tablet Take by mouth. Patient not taking: Reported on 10/31/2022 09/19/21   [provider]      Allergies    Patient has no known  allergies.    Review of Systems   Review of Systems  Respiratory:  Positive for cough.   All other systems reviewed and are negative.   Physical Exam Updated Vital Signs BP (!) 150/90 (BP Location: Right Arm)   Pulse 73   Temp 98.7 F (37.1 C) (Oral)   Resp 18   Ht 5\' 2"  (1.575 m)   Wt 131.5 kg   LMP  (LMP Unknown)   SpO2 95%   BMI 53.04 kg/m  Physical Exam Vitals and nursing note reviewed.  Constitutional:      General: She is not in acute distress.    Appearance: She is well-developed.  HENT:     Head: Normocephalic and  atraumatic.  Eyes:     Conjunctiva/sclera: Conjunctivae normal.  Cardiovascular:     Rate and Rhythm: Normal rate and regular rhythm.     Heart sounds: No murmur heard. Pulmonary:     Effort: Pulmonary effort is normal. No respiratory distress.     Breath sounds: Normal breath sounds. No stridor. No wheezing, rhonchi or rales.  Abdominal:     Palpations: Abdomen is soft.     Tenderness: There is no abdominal tenderness. There is no guarding.  Musculoskeletal:        General: No swelling.     Cervical back: Neck supple.     Right lower leg: No edema.     Left lower leg: No edema.  Skin:    General: Skin is warm and dry.     Capillary Refill: Capillary refill takes less than 2 seconds.  Neurological:     Mental Status: She is alert.  Psychiatric:        Mood and Affect: Mood normal.     ED Results / Procedures / Treatments   Labs (all labs ordered are listed, but only abnormal results are displayed) Labs Reviewed - No data to display  EKG None  Radiology DG Chest [facility] 1 View  Result Date: 06/22/2023 CLINICAL DATA:  cough EXAM: PORTABLE CHEST - 1 VIEW COMPARISON:  11/22/2012 FINDINGS: Lungs are clear. Heart size upper limits normal for technique. No effusion. Visualized bones unremarkable. IMPRESSION: No acute cardiopulmonary disease. Electronically Signed   By: Corlis Leak M.D.   On: 06/22/2023 15:02    Procedures Procedures    Medications Ordered in ED Medications - No data to display  ED Course/ Medical Decision Making/ A&P                                 Medical Decision Making Amount and/or Complexity of Data Reviewed Radiology: ordered.  Risk Prescription drug management.   This patient presents to the ED for concern of cough, nasal congestion, this involves an extensive number of treatment options, and is a complaint that carries with it a high risk of complications and morbidity.  The differential diagnosis includes COVID, RSV, influenza, pneumonia,  sepsis, asthma   Co morbidities that complicate the patient evaluation  See HPI   Additional history obtained:  Additional history obtained from EMR External records from outside source obtained and reviewed including hospital records   Lab Tests:  N/a   Imaging Studies ordered:  I ordered imaging studies including chest x-ray I independently visualized and interpreted imaging which showed no acute cardiopulmonary abnormalities I agree with the radiologist interpretation   Cardiac Monitoring: / EKG:  The patient was maintained on a cardiac monitor.  I personally  viewed and interpreted the cardiac monitored which showed an underlying rhythm of: Sinus rhythm   Consultations Obtained:  N/a   Problem List / ED Course / Critical interventions / Medication management  Cough, nasal congestion Reevaluation of the patient showed that the patient stayed the same I have reviewed the patients home medicines and have made adjustments as needed   Social Determinants of Health:  Denies tobacco, illicit drug use   Test / Admission - Considered:  Cough, nasal congestion Vitals signs significant for hypertension blood pressure 150/90. Otherwise within normal range and stable throughout visit. Imaging studies significant for: See above 52 year old female presents emergency department with continued complaints of cough, nasal congestion.  Symptoms been present for the past 5 to 6 days with recent COVID diagnosis 3 to 4 days ago.  Patient states that overall, symptoms have improved since symptom onset.  Was mainly seen here due to advice given by nurse over the phone because she was still positive for COVID using an at home test earlier today.  Patient overall well-appearing without hypoxia, tachypnea, tachycardia, fever.  Chest x-ray is obtained due to patient's continued but improving cough for assessment of possible pneumonia which was negative.  I suspect the patient's symptoms  are still likely secondary to resolving COVID.  Will recommend continued symptomatic therapy as depicted in AVS and follow-up with primary care for reassessment of symptoms.  Treatment plan discussed at length with patient and she acknowledged understanding was agreeable to said plan. Worrisome signs and symptoms were discussed with the patient, and the patient acknowledged understanding to return to the ED if noticed. Patient was stable upon discharge.          Final Clinical Impression(s) / ED Diagnoses Final diagnoses:  Subacute cough    Rx / DC Orders ED Discharge Orders          Ordered    benzonatate (TESSALON) 100 MG capsule  3 times daily PRN        06/22/23 1518              Peter Garter, PA 06/22/23 1523    Virgina Norfolk, DO 06/22/23 [id number]

## 2023-06-22 NOTE — ED Triage Notes (Signed)
Pt arrives pov, steady gait, endorse covid + test x 5 days pta. Speaking in complete sentences. Reports shob with "cough, congestion and wheezing". Denies fever. Pt denies and no s/s of distress

## 2023-07-02 ENCOUNTER — Ambulatory Visit: Payer: Commercial Managed Care - HMO

## 2023-07-02 ENCOUNTER — Other Ambulatory Visit: Payer: Self-pay

## 2023-07-24 ENCOUNTER — Ambulatory Visit (INDEPENDENT_AMBULATORY_CARE_PROVIDER_SITE_OTHER): Payer: Commercial Managed Care - HMO | Admitting: Infectious Disease

## 2023-07-24 ENCOUNTER — Other Ambulatory Visit (HOSPITAL_COMMUNITY)
Admission: RE | Admit: 2023-07-24 | Discharge: 2023-07-24 | Disposition: A | Discharge status: Home / Self Care | Payer: Commercial Managed Care - HMO | Source: Ambulatory Visit | Attending: Infectious Disease | Admitting: Infectious Disease

## 2023-07-24 ENCOUNTER — Other Ambulatory Visit: Payer: Self-pay

## 2023-07-24 ENCOUNTER — Encounter: Payer: Self-pay | Admitting: Infectious Disease

## 2023-07-24 VITALS — BP 134/84 | HR 73 | Temp 98.2°F | Ht 62.0 in | Wt 299.0 lb

## 2023-07-24 DIAGNOSIS — Z7185 Encounter for immunization safety counseling: Secondary | ICD-10-CM | POA: Diagnosis not present

## 2023-07-24 DIAGNOSIS — B20 Human immunodeficiency virus [HIV] disease: Secondary | ICD-10-CM

## 2023-07-24 DIAGNOSIS — Z2809 Immunization not carried out because of other contraindication: Secondary | ICD-10-CM

## 2023-07-24 DIAGNOSIS — Z23 Encounter for immunization: Secondary | ICD-10-CM

## 2023-07-24 DIAGNOSIS — Z6841 Body Mass Index (BMI) 40.0 and over, adult: Secondary | ICD-10-CM

## 2023-07-24 DIAGNOSIS — Z113 Encounter for screening for infections with a predominantly sexual mode of transmission: Secondary | ICD-10-CM

## 2023-07-24 MED ORDER — ATORVASTATIN CALCIUM 20 MG PO TABS: 20.0000 mg | ORAL_TABLET | Freq: Every day | ORAL | 11 refills | Status: DC

## 2023-07-24 MED ORDER — BIKTARVY 50-200-25 MG PO TABS: 1.0000 | ORAL_TABLET | Freq: Every day | ORAL | 11 refills | Status: DC

## 2023-07-24 NOTE — Progress Notes (Signed)
Subjective:  Chief complaint: Follow-up for HIV disease on the medications  Patient ID: Regina Mcdonald, female    DOB: Oct 24, 1971, 51 y.o.   MRN: [id number]  HPI  52 year old African American woman with morbid obesity and HIV who has appeared to be a long term non-progressor. We had  Her previously on Atripla via START study   She then about of care for some time.  We tried to place her on Dovato but her insurance company would not cover it so we placed her on BIKTARVY which she is taking currently.  We discussed different antiretroviral regimens at prior visit and she was actually quite interested in [place].  I endeavored to get Cabenuva  for her yet but was not approved by her insurance  She saw Rexene Alberts as well with concerns about weight gain.  Seen primary care but not been able to get on a drug to lose weight at this point in time.  She plans on changing insurance when open enrollment occurs.       Past Medical History:  Diagnosis Date   Depression 05/16/2020   Moodiness 07/18/2020   Obesity (BMI 30-39.9) 05/16/2020   Routine screening for STI (sexually transmitted infection) 09/27/2021    Past Surgical History:  Procedure Laterality Date   HERNIA REPAIR      Family History  Problem Relation Age of Onset   Cancer Neg Hx    Hypertension Neg Hx    Diabetes Neg Hx       Social History   Socioeconomic History   Marital status: Widowed    Spouse name: Not on file   Number of children: Not on file   Years of education: Not on file   Highest education level: Not on file  Occupational History   Not on file  Tobacco Use   Smoking status: Never   Smokeless tobacco: Never  Vaping Use   Vaping status: Never Used  Substance and Sexual Activity   Alcohol use: No   Drug use: No   Sexual activity: Not Currently    Comment: declined condoms  Other Topics Concern   Not on file  Social History Narrative   Not on file   Social Determinants of Health    Financial Resource Strain: Low Risk  (12/27/2022)   Received [facility]   Overall Financial Resource Strain (CARDIA)    Difficulty of Paying Living Expenses: Not hard at all  Food Insecurity: Low Risk  (01/24/2023)   [facility]   Hunger Vital Sign    Worried About Running Out of Food in the Last Year: Never true    Ran Out of Food in the Last Year: Never true  Transportation Needs: No Transportation Needs (01/24/2023)   [facility]   Transportation    In the past 12 months, has lack of reliable transportation kept you from medical appointments, meetings, work or from getting things needed for daily living? : No  Physical Activity: Unknown (12/22/2022)   Received from [facility], [facility]   Exercise Vital Sign    Days of Exercise per Week: 1 day    Minutes of Exercise per Session: Not on file  Stress: No Stress Concern Present (09/20/2021)   Received from [facility], [facility] - Occupational Stress Questionnaire    Feeling of Stress : Not at all  Social Connections: Unknown (04/03/2022)  Received from [facility], [facility]   Social Network    Social Network: Not on file    No Known Allergies   Current Outpatient Medications:    albuterol (VENTOLIN HFA) 108 (90 Base) MCG/ACT inhaler, Inhale 2 puffs into the lungs every 6 (six) hours as needed for wheezing or shortness of breath., Disp: 8 g, Rfl: 2   amoxicillin-clavulanate (AUGMENTIN) 875-125 MG tablet, Take 1 tablet by mouth every 12 (twelve) hours., Disp: 14 tablet, Rfl: 0   atorvastatin (LIPITOR) 20 MG tablet, Take 1 tablet (20 mg total) by mouth daily. (Patient not taking: Reported on 10/31/2022), Disp: 30 tablet, Rfl: 11   benzonatate (TESSALON) 100 MG capsule, Take 1 capsule (100 mg total) by mouth 3 (three) times daily as needed., Disp: 21 capsule, Rfl: 0    bictegravir-emtricitabine-tenofovir AF (BIKTARVY) 50-200-25 MG TABS tablet, Take 1 tablet by mouth daily., Disp: 30 tablet, Rfl: 5   buPROPion (WELLBUTRIN XL) 150 MG 24 hr tablet, Take 1 tablet (150 mg total) by mouth daily., Disp: 30 tablet, Rfl: 2   diclofenac (VOLTAREN) 75 MG EC tablet, Take 1 tablet (75 mg total) by mouth 2 (two) times daily., Disp: 50 tablet, Rfl: 2   meloxicam (MOBIC) 15 MG tablet, Take 1 tablet (15 mg total) by mouth daily., Disp: 30 tablet, Rfl: 1   ondansetron (ZOFRAN-ODT) 4 MG disintegrating tablet, 4mg  ODT q4 hours prn nausea/vomit, Disp: 20 tablet, Rfl: 0   phentermine (ADIPEX-P) 37.5 MG tablet, Take by mouth. (Patient not taking: Reported on 10/31/2022), Disp: , Rfl:    Review of Systems  Constitutional:  Negative for activity change, appetite change, chills, diaphoresis, fatigue, fever and unexpected weight change.  HENT:  Negative for congestion, rhinorrhea, sinus pressure, sneezing, sore throat and trouble swallowing.   Eyes:  Negative for photophobia and visual disturbance.  Respiratory:  Negative for cough, chest tightness, shortness of breath, wheezing and stridor.   Cardiovascular:  Negative for chest pain, palpitations and leg swelling.  Gastrointestinal:  Negative for abdominal distention, abdominal pain, anal bleeding, blood in stool, constipation, diarrhea, nausea and vomiting.  Genitourinary:  Negative for difficulty urinating, dysuria, flank pain and hematuria.  Musculoskeletal:  Negative for arthralgias, back pain, gait problem, joint swelling and myalgias.  Skin:  Negative for color change, pallor, rash and wound.  Neurological:  Negative for dizziness, tremors, weakness and light-headedness.  Hematological:  Negative for adenopathy. Does not bruise/bleed easily.  Psychiatric/Behavioral:  Negative for agitation, behavioral problems, confusion, decreased concentration, dysphoric mood and sleep disturbance.        Objective:   Physical  Exam Constitutional:      General: She is not in acute distress.    Appearance: Normal appearance. She is well-developed. She is not ill-appearing or diaphoretic.  HENT:     Head: Normocephalic and atraumatic.     Right Ear: Hearing and external ear normal.     Left Ear: Hearing and external ear normal.     Nose: No nasal deformity or rhinorrhea.  Eyes:     General: No scleral icterus.    Conjunctiva/sclera: Conjunctivae normal.     Right eye: Right conjunctiva is not injected.     Left eye: Left conjunctiva is not injected.     Pupils: Pupils are equal, round, and reactive to light.  Neck:     Vascular: No JVD.  Cardiovascular:     Rate and Rhythm: Normal rate and regular rhythm.     Heart sounds: S1 normal and S2 normal.  Abdominal:     General: Bowel sounds are normal. There is no distension.     Palpations: Abdomen is soft.     Tenderness: There is no abdominal tenderness.  Musculoskeletal:        General: Normal range of motion.     Right shoulder: Normal.     Left shoulder: Normal.     Cervical back: Normal range of motion and neck supple.     Right hip: Normal.     Left hip: Normal.     Right knee: Normal.     Left knee: Normal.  Lymphadenopathy:     Head:     Right side of head: No submandibular, preauricular or posterior auricular adenopathy.     Left side of head: No submandibular, preauricular or posterior auricular adenopathy.     Cervical: No cervical adenopathy.     Right cervical: No superficial or deep cervical adenopathy.    Left cervical: No superficial or deep cervical adenopathy.  Skin:    General: Skin is warm and dry.     Coloration: Skin is not pale.     Findings: No abrasion, bruising, ecchymosis, erythema, lesion or rash.     Nails: There is no clubbing.  Neurological:     General: No focal deficit present.     Mental Status: She is alert and oriented to person, place, and time.     Sensory: No sensory deficit.     Coordination: Coordination  normal.     Gait: Gait normal.  Psychiatric:        Attention and Perception: She is attentive.        Mood and Affect: Mood normal.        Speech: Speech normal.        Behavior: Behavior normal. Behavior is cooperative.        Thought Content: Thought content normal.        Judgment: Judgment normal.           Assessment & Plan:   HIV disease:  I will add order HIV viral load CD4 count CBC with differential CMP, RPR GC and chlamydia and I will continue  [facility] [employer]  prescription  We did have discussions about trying to switch to Dovato or switch to a nonintegrase strand transfer inhibitor based regimen such as Delstrigo, but she would prefer to be on BIKTARVY.  Hyperlipidemia: I am sending in prescription for Lipitor again.  Obesity and pre-diabetes: she is going to endeavor to change insurance plans with hope she can get onto a weight loss drug  Seen counseling recommended updated flu and COVID-vaccine and she will receive the former here today as the latter is not yet available

## 2023-07-25 LAB — COMPLETE METABOLIC PANEL WITH GFR
AG Ratio: 1.6 (calc) (ref 1.0–2.5)
ALT: 14 U/L (ref 6–29)
AST: 20 U/L (ref 10–35)
Albumin: 4.5 g/dL (ref 3.6–5.1)
Alkaline phosphatase (APISO): 54 U/L (ref 37–153)
BUN: 14 mg/dL (ref 7–25)
CO2: 27 mmol/L (ref 20–32)
Calcium: 9.2 mg/dL (ref 8.6–10.4)
Chloride: 107 mmol/L (ref 98–110)
Creat: 0.7 mg/dL (ref 0.50–1.03)
Globulin: 2.9 g/dL (ref 1.9–3.7)
Glucose, Bld: 86 mg/dL (ref 65–99)
Potassium: 3.9 mmol/L (ref 3.5–5.3)
Sodium: 143 mmol/L (ref 135–146)
Total Bilirubin: 0.3 mg/dL (ref 0.2–1.2)
Total Protein: 7.4 g/dL (ref 6.1–8.1)
eGFR: 104 mL/min/{1.73_m2} (ref >=60)

## 2023-07-25 LAB — CBC WITH DIFFERENTIAL/PLATELET
Absolute Monocytes: 442 {cells}/uL (ref 200–950)
Basophils Absolute: 21 {cells}/uL (ref 0–200)
Basophils Relative: 0.3 %
Eosinophils Absolute: 110 {cells}/uL (ref 15–500)
Eosinophils Relative: 1.6 %
HCT: 34.4 % — ABNORMAL LOW (ref 35.0–45.0)
Hemoglobin: 11.4 g/dL — ABNORMAL LOW (ref 11.7–15.5)
Lymphs Abs: 1773 {cells}/uL (ref 850–3900)
MCH: 29.8 pg (ref 27.0–33.0)
MCHC: 33.1 g/dL (ref 32.0–36.0)
MCV: 89.8 fL (ref 80.0–100.0)
MPV: 10.6 fL (ref 7.5–12.5)
Monocytes Relative: 6.4 %
Neutro Abs: 4554 {cells}/uL (ref 1500–7800)
Neutrophils Relative %: 66 %
Platelets: 279 10*3/uL (ref 140–400)
RBC: 3.83 10*6/uL (ref 3.80–5.10)
RDW: 12.5 % (ref 11.0–15.0)
Total Lymphocyte: 25.7 %
WBC: 6.9 10*3/uL (ref 3.8–10.8)

## 2023-07-25 LAB — RPR: RPR Ser Ql: NONREACTIVE

## 2023-07-25 LAB — LIPID PANEL
Cholesterol: 161 mg/dL (ref <200)
HDL: 57 mg/dL (ref >=50)
LDL Cholesterol (Calc): 81 mg/dL
Non-HDL Cholesterol (Calc): 104 mg/dL (ref <130)
Total CHOL/HDL Ratio: 2.8 (calc) (ref <5.0)
Triglycerides: 135 mg/dL (ref <150)

## 2023-07-25 LAB — T-HELPER CELLS (CD4) COUNT (NOT AT ARMC)
CD4 % Helper T Cell: 53 % (ref 33–65)
CD4 T Cell Abs: 915 /uL (ref 400–1790)

## 2023-07-25 LAB — HIV-1 RNA QUANT-NO REFLEX-BLD
HIV 1 RNA Quant: NOT DETECTED {copies}/mL
HIV-1 RNA Quant, Log: NOT DETECTED {Log_copies}/mL

## 2023-07-26 LAB — URINE CYTOLOGY ANCILLARY ONLY
Chlamydia: NEGATIVE
Comment: NEGATIVE
Comment: NORMAL
Neisseria Gonorrhea: NEGATIVE

## 2023-11-22 ENCOUNTER — Other Ambulatory Visit (HOSPITAL_COMMUNITY): Payer: Self-pay

## 2024-01-05 ENCOUNTER — Encounter (HOSPITAL_BASED_OUTPATIENT_CLINIC_OR_DEPARTMENT_OTHER): Payer: Self-pay | Admitting: Urology

## 2024-01-05 ENCOUNTER — Other Ambulatory Visit: Payer: Self-pay

## 2024-01-05 ENCOUNTER — Emergency Department (HOSPITAL_BASED_OUTPATIENT_CLINIC_OR_DEPARTMENT_OTHER)
Admission: EM | Admit: 2024-01-05 | Discharge: 2024-01-05 | Disposition: A | Discharge status: Home / Self Care | Payer: Commercial Managed Care - HMO | Attending: Emergency Medicine | Admitting: Emergency Medicine

## 2024-01-05 DIAGNOSIS — B20 Human immunodeficiency virus [HIV] disease: Secondary | ICD-10-CM | POA: Insufficient documentation

## 2024-01-05 DIAGNOSIS — S6992XA Unspecified injury of left wrist, hand and finger(s), initial encounter: Secondary | ICD-10-CM | POA: Diagnosis present

## 2024-01-05 DIAGNOSIS — W260XXA Contact with knife, initial encounter: Secondary | ICD-10-CM | POA: Insufficient documentation

## 2024-01-05 DIAGNOSIS — S61211A Laceration without foreign body of left index finger without damage to nail, initial encounter: Secondary | ICD-10-CM | POA: Insufficient documentation

## 2024-01-05 NOTE — Discharge Instructions (Signed)
As we discussed, your workup in the ER today was reassuring for acute findings.  Your wound is extremely superficial and should heal on its own.  I have given you an hemostatic dressing that should help it did not bleed anymore.  Please keep this on for least 24 hours.  After you take it off, rinse the wound thoroughly with soap and water and apply an antibiotic agent and a dressing.  Follow-up with your primary doctor to have a wound check in the next few days.  Return if development of any new or worsening symptoms.

## 2024-01-05 NOTE — ED Provider Notes (Signed)
[place] [facility] Provider Note   CSN: [id number] Arrival date & time: 01/05/24  1541     History  Chief Complaint  Patient presents with   Laceration    Regina Mcdonald is a 53 y.o. female.  Patient with history of HIV compliant with her Regina Mcdonald presents today with complaints of fingertip laceration.  She states that same occurred immediately prior to arrival today when she cut the tip of her left pointer finger with a knife.  States that she has had difficulty achieving hemostasis and presents with concern for same.  Her tetanus is up-to-date.  Denies any other injuries or complaints.  The history is provided by the patient. No language interpreter was used.  Laceration      Home Medications Prior to Admission medications   Medication Sig Start Date End Date Taking? Authorizing Provider  albuterol (VENTOLIN HFA) 108 (90 Base) MCG/ACT inhaler Inhale 2 puffs into the lungs every 6 (six) hours as needed for wheezing or shortness of breath. 06/18/23   Elson Areas, PA-C  atorvastatin (LIPITOR) 20 MG tablet Take 1 tablet (20 mg total) by mouth daily. 07/24/23   Randall Hiss, MD  benzonatate (TESSALON) 100 MG capsule Take 1 capsule (100 mg total) by mouth 3 (three) times daily as needed. Patient not taking: Reported on 07/24/2023 06/22/23   Sherian Maroon A, PA  bictegravir-emtricitabine-tenofovir AF (BIKTARVY) 50-200-25 MG TABS tablet Take 1 tablet by mouth daily. 07/24/23   Randall Hiss, MD  buPROPion (WELLBUTRIN XL) 150 MG 24 hr tablet Take 1 tablet (150 mg total) by mouth daily. Patient not taking: Reported on 07/24/2023 10/31/22   Blanchard Kelch, NP  diclofenac (VOLTAREN) 75 MG EC tablet Take 1 tablet (75 mg total) by mouth 2 (two) times daily. Patient not taking: Reported on 07/24/2023 07/11/22   Lenn Sink, DPM  meloxicam (MOBIC) 15 MG tablet Take 1 tablet (15 mg total) by mouth daily. Patient not taking: Reported on  07/24/2023 02/14/23   Lenn Sink, DPM  ondansetron (ZOFRAN-ODT) 4 MG disintegrating tablet 4mg  ODT q4 hours prn nausea/vomit Patient not taking: Reported on 07/24/2023 11/02/22   Melene Plan, DO  phentermine (ADIPEX-P) 37.5 MG tablet Take by mouth. Patient not taking: Reported on 10/31/2022 09/19/21   [provider]      Allergies    Patient has no known allergies.    Review of Systems   Review of Systems  Skin:  Positive for wound.  All other systems reviewed and are negative.   Physical Exam Updated Vital Signs BP (!) 154/69 (BP Location: Right Arm)   Pulse 80   Temp 98 F (36.7 C)   Resp 18   Ht 5\' 2"  (1.575 m)   Wt 135.6 kg   SpO2 98%   BMI 54.68 kg/m  Physical Exam Vitals and nursing note reviewed.  Constitutional:      General: She is not in acute distress.    Appearance: Normal appearance. She is normal weight. She is not ill-appearing, toxic-appearing or diaphoretic.  HENT:     Head: Normocephalic and atraumatic.  Cardiovascular:     Rate and Rhythm: Normal rate.  Pulmonary:     Effort: Pulmonary effort is normal. No respiratory distress.  Musculoskeletal:        General: Normal range of motion.     Cervical back: Normal range of motion.  Skin:    General: Skin is warm and dry.  Comments: Extremely small superficial skin flap laceration noted to the tip of the left pointer finger.  No nailbed involvement.  Good capillary refill.  Bleeding controlled.  ROM intact without issue.  Neurological:     General: No focal deficit present.     Mental Status: She is alert.  Psychiatric:        Mood and Affect: Mood normal.        Behavior: Behavior normal.     ED Results / Procedures / Treatments   Labs (all labs ordered are listed, but only abnormal results are displayed) Labs Reviewed - No data to display  EKG None  Radiology No results found.  Procedures Procedures    Medications Ordered in ED Medications - No data to display  ED  Course/ Medical Decision Making/ A&P                                 Medical Decision Making  Patient presents today with complaints of left pointer finger laceration immediately prior to arrival today.  She is afebrile, nontoxic-appearing, and in no acute distress with reassuring vital signs.  Physical exam reveals extremely superficial skin flap laceration noted to the tip of the left pointer finger.  She has good ROM and good distal sensation.  No indication for x-ray imaging at this time.  The wound not amenable to repair.  Tetanus is up-to-date.  Her bleeding is controlled, however patient has concern that it will start bleeding again and therefore I did provide her with combat gauze that she can use at home as needed.  Recommend regular wound cleaning and close outpatient follow-up for wound check. Evaluation and diagnostic testing in the emergency department does not suggest an emergent condition requiring admission or immediate intervention beyond what has been performed at this time.  Plan for discharge with close PCP follow-up.  Patient is understanding and amenable with plan, educated on red flag symptoms that would prompt immediate return.  Patient discharged in stable condition.  Final Clinical Impression(s) / ED Diagnoses Final diagnoses:  Laceration of left index finger without foreign body without damage to nail, initial encounter    Rx / DC Orders ED Discharge Orders     None     An After Visit Summary was printed and given to the patient.     Vear Clock 01/05/24 Regina Sams, MD 01/05/24 [id number]

## 2024-01-05 NOTE — ED Triage Notes (Signed)
Pt cut tip of left pointer finger with knife  Top piece of skin is flapped over  Bleeding controlled at this time    TDAP utd

## 2024-01-21 NOTE — Progress Notes (Unsigned)
 Subjective:  Chief complaint: follow-up for HIV disease on medications   Patient ID: Regina Mcdonald, female    DOB: 1971/07/30, 53 y.o.   MRN: [id number]  HPI  Discussed the use of AI scribe software for clinical note transcription with the patient, who gave verbal consent to proceed.  History of Present Illness   Regina Mcdonald, a patient with a long-standing history of well-controlled HIV, presents for a routine follow-up. She has been on Biktarvy for her HIV management and Lipitor for cholesterol control due to the increased risk of cardiovascular disease in patients over forty with HIV. She reports struggles with weight management, with weight fluctuations and unsuccessful attempts at obtaining weight suppressant medications. She expresses interest in scheduling a physical and exploring options for weight suppressant pills.  In terms of her HIV management, she has been stable on Biktarvy, with no reported side effects. The patient and provider discuss the potential association between HIV medications and weight gain, with the provider explaining that newer medications like Biktarvy and Dovato likely do not cause weight gain directly, but rather, the lack of side effects such as nausea may lead to normal eating patterns and subsequent weight gain.      Past Medical History:  Diagnosis Date   Depression 05/16/2020   Moodiness 07/18/2020   Obesity (BMI 30-39.9) 05/16/2020   Routine screening for STI (sexually transmitted infection) 09/27/2021    Past Surgical History:  Procedure Laterality Date   HERNIA REPAIR      Family History  Problem Relation Age of Onset   Cancer Neg Hx    Hypertension Neg Hx    Diabetes Neg Hx       Social History   Socioeconomic History   Marital status: Widowed    Spouse name: Not on file   Number of children: Not on file   Years of education: Not on file   Highest education level: Not on file  Occupational History   Not on file  Tobacco Use   Smoking  status: Never   Smokeless tobacco: Never  Vaping Use   Vaping status: Never Used  Substance and Sexual Activity   Alcohol use: No   Drug use: No   Sexual activity: Not Currently    Comment: declined condoms  Other Topics Concern   Not on file  Social History Narrative   Not on file   Social Drivers of Health   Financial Resource Strain: Low Risk  (12/27/2022)   [facility], [facility]   Overall Financial Resource Strain (CARDIA)    Difficulty of Paying Living Expenses: Not hard at all  Food Insecurity: Low Risk  (01/24/2023)   [facility]   Hunger Vital Sign    Worried About Running Out of Food in the Last Year: Never true    Ran Out of Food in the Last Year: Never true  Transportation Needs: No Transportation Needs (01/24/2023)   [facility]   Transportation    In the past 12 months, has lack of reliable transportation kept you from medical appointments, meetings, work or from getting things needed for daily living? : No  Physical Activity: Unknown (12/22/2022)   Received from [facility], [facility]   Exercise Vital Sign    Days of Exercise per Week: 1 day    Minutes of Exercise per Session: Not on file  Stress: No Stress Concern Present (09/20/2021)   Received from [facility], [facility]  [employer] of Occupational Health - Occupational Stress Questionnaire    Feeling of Stress : Not at all  Social Connections: Unknown (04/03/2022)   Received from [facility], [facility]   Social Network    Social Network: Not on file    No Known Allergies   Current Outpatient Medications:    albuterol (VENTOLIN HFA) 108 (90 Base) MCG/ACT inhaler, Inhale 2 puffs into the lungs every 6 (six) hours as needed for wheezing or shortness of breath., Disp: 8 g, Rfl: 2   atorvastatin (LIPITOR) 20 MG tablet, Take 1 tablet (20 mg total) by mouth daily., Disp: 30 tablet, Rfl: 11   benzonatate  (TESSALON) 100 MG capsule, Take 1 capsule (100 mg total) by mouth 3 (three) times daily as needed. (Patient not taking: Reported on 07/24/2023), Disp: 21 capsule, Rfl: 0   bictegravir-emtricitabine-tenofovir AF (BIKTARVY) 50-200-25 MG TABS tablet, Take 1 tablet by mouth daily., Disp: 30 tablet, Rfl: 11   buPROPion (WELLBUTRIN XL) 150 MG 24 hr tablet, Take 1 tablet (150 mg total) by mouth daily. (Patient not taking: Reported on 07/24/2023), Disp: 30 tablet, Rfl: 2   diclofenac (VOLTAREN) 75 MG EC tablet, Take 1 tablet (75 mg total) by mouth 2 (two) times daily. (Patient not taking: Reported on 07/24/2023), Disp: 50 tablet, Rfl: 2   meloxicam (MOBIC) 15 MG tablet, Take 1 tablet (15 mg total) by mouth daily. (Patient not taking: Reported on 07/24/2023), Disp: 30 tablet, Rfl: 1   ondansetron (ZOFRAN-ODT) 4 MG disintegrating tablet, 4mg  ODT q4 hours prn nausea/vomit (Patient not taking: Reported on 07/24/2023), Disp: 20 tablet, Rfl: 0   phentermine (ADIPEX-P) 37.5 MG tablet, Take by mouth. (Patient not taking: Reported on 10/31/2022), Disp: , Rfl:     Past Medical History:  Diagnosis Date   Depression 05/16/2020   Moodiness 07/18/2020   Obesity (BMI 30-39.9) 05/16/2020   Routine screening for STI (sexually transmitted infection) 09/27/2021    Past Surgical History:  Procedure Laterality Date   HERNIA REPAIR      Family History  Problem Relation Age of Onset   Cancer Neg Hx    Hypertension Neg Hx    Diabetes Neg Hx       Social History   Socioeconomic History   Marital status: Widowed    Spouse name: Not on file   Number of children: Not on file   Years of education: Not on file   Highest education level: Not on file  Occupational History   Not on file  Tobacco Use   Smoking status: Never   Smokeless tobacco: Never  Vaping Use   Vaping status: Never Used  Substance and Sexual Activity   Alcohol use: No   Drug use: No   Sexual activity: Not Currently    Comment: declined condoms  Other  Topics Concern   Not on file  Social History Narrative   Not on file   Social Drivers of Health   Financial Resource Strain: Low Risk  (12/27/2022)   Received [facility], [facility]   Overall Financial Resource Strain (CARDIA)    Difficulty of Paying Living Expenses: Not hard at all  Food Insecurity: Low Risk  (01/24/2023)   [facility]   Hunger Vital Sign    Worried About Running Out of Food in the Last Year: Never true    Ran Out of Food in the Last Year: Never true  Transportation Needs: No Transportation Needs (01/24/2023)   [facility]   Transportation    In the past 12 months, has lack of reliable transportation kept you from medical appointments, meetings, work or from getting things needed for daily living? : No  Physical Activity: Unknown (12/22/2022)   Received [facility], [facility]   Exercise Vital Sign    Days of Exercise per Week: 1 day    Minutes of Exercise per Session: Not on file  Stress: No Stress Concern Present (09/20/2021)   Received from [facility], [facility] of Occupational Health - Occupational Stress Questionnaire    Feeling of Stress : Not at all  Social Connections: Unknown (04/03/2022)   Received from [facility], [facility]   Social Network    Social Network: Not on file    No Known Allergies   Current Outpatient Medications:    albuterol (VENTOLIN HFA) 108 (90 Base) MCG/ACT inhaler, Inhale 2 puffs into the lungs every 6 (six) hours as needed for wheezing or shortness of breath., Disp: 8 g, Rfl: 2   atorvastatin (LIPITOR) 20 MG tablet, Take 1 tablet (20 mg total) by mouth daily., Disp: 30 tablet, Rfl: 11   benzonatate (TESSALON) 100 MG capsule, Take 1 capsule (100 mg total) by mouth 3 (three) times daily as needed. (Patient not taking: Reported on 07/24/2023), Disp: 21 capsule, Rfl: 0   bictegravir-emtricitabine-tenofovir AF (BIKTARVY)  50-200-25 MG TABS tablet, Take 1 tablet by mouth daily., Disp: 30 tablet, Rfl: 11   buPROPion (WELLBUTRIN XL) 150 MG 24 hr tablet, Take 1 tablet (150 mg total) by mouth daily. (Patient not taking: Reported on 07/24/2023), Disp: 30 tablet, Rfl: 2   diclofenac (VOLTAREN) 75 MG EC tablet, Take 1 tablet (75 mg total) by mouth 2 (two) times daily. (Patient not taking: Reported on 07/24/2023), Disp: 50 tablet, Rfl: 2   meloxicam (MOBIC) 15 MG tablet, Take 1 tablet (15 mg total) by mouth daily. (Patient not taking: Reported on 07/24/2023), Disp: 30 tablet, Rfl: 1   ondansetron (ZOFRAN-ODT) 4 MG disintegrating tablet, 4mg  ODT q4 hours prn nausea/vomit (Patient not taking: Reported on 07/24/2023), Disp: 20 tablet, Rfl: 0   phentermine (ADIPEX-P) 37.5 MG tablet, Take by mouth. (Patient not taking: Reported on 10/31/2022), Disp: , Rfl:    Review of Systems  Constitutional:  Negative for chills and fever.  HENT:  Negative for congestion and sore throat.   Eyes:  Negative for photophobia.  Respiratory:  Negative for cough, shortness of breath and wheezing.   Cardiovascular:  Negative for chest pain, palpitations and leg swelling.  Gastrointestinal:  Negative for abdominal pain, blood in stool, constipation, diarrhea, nausea and vomiting.  Genitourinary:  Negative for dysuria, flank pain and hematuria.  Musculoskeletal:  Negative for back pain and myalgias.  Skin:  Negative for rash.  Neurological:  Negative for dizziness, weakness and headaches.  Hematological:  Does not bruise/bleed easily.  Psychiatric/Behavioral:  Negative for suicidal ideas.        Objective:   Physical Exam Constitutional:      General: She is not in acute distress.    Appearance: Normal appearance. She is well-developed. She is not ill-appearing or diaphoretic.  HENT:     Head: Normocephalic and atraumatic.     Right Ear: Hearing and external ear normal.     Left Ear: Hearing and external ear normal.     Nose: No nasal deformity or  rhinorrhea.  Eyes:     General: No scleral icterus.    Conjunctiva/sclera:  Conjunctivae normal.     Right eye: Right conjunctiva is not injected.     Left eye: Left conjunctiva is not injected.     Pupils: Pupils are equal, round, and reactive to light.  Neck:     Vascular: No JVD.  Cardiovascular:     Rate and Rhythm: Normal rate and regular rhythm.     Heart sounds: S1 normal and S2 normal.  Pulmonary:     Effort: No respiratory distress.     Breath sounds: No wheezing.  Abdominal:     General: Bowel sounds are normal. There is no distension.     Palpations: Abdomen is soft.     Tenderness: There is no abdominal tenderness.  Musculoskeletal:        General: Normal range of motion.     Right shoulder: Normal.     Left shoulder: Normal.     Cervical back: Normal range of motion and neck supple.     Right hip: Normal.     Left hip: Normal.     Right knee: Normal.     Left knee: Normal.  Lymphadenopathy:     Head:     Right side of head: No submandibular, preauricular or posterior auricular adenopathy.     Left side of head: No submandibular, preauricular or posterior auricular adenopathy.     Cervical: No cervical adenopathy.     Right cervical: No superficial or deep cervical adenopathy.    Left cervical: No superficial or deep cervical adenopathy.  Skin:    General: Skin is warm and dry.     Coloration: Skin is not pale.     Findings: No abrasion, bruising, ecchymosis, erythema, lesion or rash.     Nails: There is no clubbing.  Neurological:     General: No focal deficit present.     Mental Status: She is alert and oriented to person, place, and time.     Sensory: No sensory deficit.     Coordination: Coordination normal.     Gait: Gait normal.  Psychiatric:        Attention and Perception: She is attentive.        Mood and Affect: Mood normal.        Speech: Speech normal.        Behavior: Behavior normal. Behavior is cooperative.        Thought Content: Thought  content normal.        Judgment: Judgment normal.           Assessment & Plan:   Assessment and Plan    HIV infection Long-standing HIV infection well-managed with Biktarvy. Viral load undetectable, CD4 count normal. Previous slight anemia noted. Weight gain not attributed to Biktarvy. - Continue Biktarvy. - Order labs for viral load and CD4 count. - Send prescriptions to [facility] on [address]. - Administer COVID-19 vaccine.  Hyperlipidemia At risk for cardiovascular disease due to age and HIV. Lipitor prescribed per [employer] study recommendations. - Continue Lipitor.  Obesity Weight fluctuations noted. Interest in weight suppressant medications discussed. Dietary strategies, including low-carb diets, and fiber supplementation discussed. - Consider weight suppressant medications if insurance allows. - Discuss dietary modifications, including low-carb diet options. - Recommend fiber supplementation.

## 2024-01-22 ENCOUNTER — Ambulatory Visit (INDEPENDENT_AMBULATORY_CARE_PROVIDER_SITE_OTHER): Payer: Commercial Managed Care - HMO | Admitting: Infectious Disease

## 2024-01-22 ENCOUNTER — Other Ambulatory Visit: Payer: Self-pay

## 2024-01-22 ENCOUNTER — Other Ambulatory Visit (HOSPITAL_COMMUNITY)
Admission: RE | Admit: 2024-01-22 | Discharge: 2024-01-22 | Disposition: A | Discharge status: Home / Self Care | Source: Ambulatory Visit | Attending: Infectious Disease | Admitting: Infectious Disease

## 2024-01-22 VITALS — BP 151/91 | HR 66 | Temp 98.3°F

## 2024-01-22 DIAGNOSIS — Z113 Encounter for screening for infections with a predominantly sexual mode of transmission: Secondary | ICD-10-CM | POA: Insufficient documentation

## 2024-01-22 DIAGNOSIS — Z7185 Encounter for immunization safety counseling: Secondary | ICD-10-CM

## 2024-01-22 DIAGNOSIS — R7303 Prediabetes: Secondary | ICD-10-CM | POA: Diagnosis not present

## 2024-01-22 DIAGNOSIS — Z23 Encounter for immunization: Secondary | ICD-10-CM | POA: Diagnosis not present

## 2024-01-22 DIAGNOSIS — J45909 Unspecified asthma, uncomplicated: Secondary | ICD-10-CM

## 2024-01-22 DIAGNOSIS — E669 Obesity, unspecified: Secondary | ICD-10-CM

## 2024-01-22 DIAGNOSIS — B20 Human immunodeficiency virus [HIV] disease: Secondary | ICD-10-CM

## 2024-01-22 MED ORDER — BIKTARVY 50-200-25 MG PO TABS: 1.0000 | ORAL_TABLET | Freq: Every day | ORAL | 11 refills | Status: DC

## 2024-01-22 MED ORDER — ATORVASTATIN CALCIUM 20 MG PO TABS: 20.0000 mg | ORAL_TABLET | Freq: Every day | ORAL | 11 refills | Status: DC

## 2024-01-23 LAB — T-HELPER CELLS (CD4) COUNT (NOT AT ARMC)
CD4 % Helper T Cell: 48 % (ref 33–65)
CD4 T Cell Abs: 753 /uL (ref 400–1790)

## 2024-01-24 LAB — COMPLETE METABOLIC PANEL WITH GFR
AG Ratio: 1.5 (calc) (ref 1.0–2.5)
ALT: 15 U/L (ref 6–29)
AST: 22 U/L (ref 10–35)
Albumin: 4.5 g/dL (ref 3.6–5.1)
Alkaline phosphatase (APISO): 51 U/L (ref 37–153)
BUN: 12 mg/dL (ref 7–25)
CO2: 29 mmol/L (ref 20–32)
Calcium: 9 mg/dL (ref 8.6–10.4)
Chloride: 107 mmol/L (ref 98–110)
Creat: 0.63 mg/dL (ref 0.50–1.03)
Globulin: 3 g/dL (ref 1.9–3.7)
Glucose, Bld: 78 mg/dL (ref 65–99)
Potassium: 3.7 mmol/L (ref 3.5–5.3)
Sodium: 143 mmol/L (ref 135–146)
Total Bilirubin: 0.4 mg/dL (ref 0.2–1.2)
Total Protein: 7.5 g/dL (ref 6.1–8.1)
eGFR: 106 mL/min/{1.73_m2} (ref >=60)

## 2024-01-24 LAB — CBC WITH DIFFERENTIAL/PLATELET
Absolute Lymphocytes: 1740 {cells}/uL (ref 850–3900)
Absolute Monocytes: 570 {cells}/uL (ref 200–950)
Basophils Absolute: 8 {cells}/uL (ref 0–200)
Basophils Relative: 0.1 %
Eosinophils Absolute: 99 {cells}/uL (ref 15–500)
Eosinophils Relative: 1.3 %
HCT: 35.7 % (ref 35.0–45.0)
Hemoglobin: 11.3 g/dL — ABNORMAL LOW (ref 11.7–15.5)
MCH: 29 pg (ref 27.0–33.0)
MCHC: 31.7 g/dL — ABNORMAL LOW (ref 32.0–36.0)
MCV: 91.8 fL (ref 80.0–100.0)
MPV: 10.4 fL (ref 7.5–12.5)
Monocytes Relative: 7.5 %
Neutro Abs: 5183 {cells}/uL (ref 1500–7800)
Neutrophils Relative %: 68.2 %
Platelets: 295 10*3/uL (ref 140–400)
RBC: 3.89 10*6/uL (ref 3.80–5.10)
RDW: 12.8 % (ref 11.0–15.0)
Total Lymphocyte: 22.9 %
WBC: 7.6 10*3/uL (ref 3.8–10.8)

## 2024-01-24 LAB — LIPID PANEL
Cholesterol: 153 mg/dL (ref <200)
HDL: 62 mg/dL (ref >=50)
LDL Cholesterol (Calc): 73 mg/dL
Non-HDL Cholesterol (Calc): 91 mg/dL (ref <130)
Total CHOL/HDL Ratio: 2.5 (calc) (ref <5.0)
Triglycerides: 94 mg/dL (ref <150)

## 2024-01-24 LAB — HIV-1 RNA QUANT-NO REFLEX-BLD
HIV 1 RNA Quant: 49 {copies}/mL — ABNORMAL HIGH
HIV-1 RNA Quant, Log: 1.69 {Log_copies}/mL — ABNORMAL HIGH

## 2024-01-24 LAB — RPR: RPR Ser Ql: NONREACTIVE

## 2024-01-24 LAB — URINE CYTOLOGY ANCILLARY ONLY
Chlamydia: NEGATIVE
Comment: NEGATIVE
Comment: NORMAL
Neisseria Gonorrhea: NEGATIVE

## 2024-04-08 NOTE — Progress Notes (Signed)
 The 10-year ASCVD risk score (Arnett DK, et al., 2019) is: 5.1%   Values used to calculate the score:     Age: 53 years     Sex: Female     Is Non-Hispanic African American: Yes     Diabetic: No     Tobacco smoker: No     Systolic Blood Pressure: 149 mmHg     Is BP treated: Yes     HDL Cholesterol: 62 mg/dL     Total Cholesterol: 153 mg/dL  Currently prescribed atorvastatin  20 mg.  Chereese Cilento, BSN, RN

## 2024-07-27 ENCOUNTER — Other Ambulatory Visit: Payer: Self-pay

## 2024-07-27 ENCOUNTER — Other Ambulatory Visit (HOSPITAL_COMMUNITY): Payer: Self-pay

## 2024-07-27 ENCOUNTER — Other Ambulatory Visit (HOSPITAL_COMMUNITY)
Admission: RE | Admit: 2024-07-27 | Discharge: 2024-07-27 | Disposition: A | Discharge status: Home / Self Care | Source: Ambulatory Visit | Attending: Infectious Disease | Admitting: Infectious Disease

## 2024-07-27 ENCOUNTER — Ambulatory Visit: Admitting: Infectious Disease

## 2024-07-27 ENCOUNTER — Encounter: Payer: Self-pay | Admitting: Infectious Disease

## 2024-07-27 VITALS — BP 140/69 | HR 71 | Temp 98.6°F | Wt 289.0 lb

## 2024-07-27 DIAGNOSIS — Z6841 Body Mass Index (BMI) 40.0 and over, adult: Secondary | ICD-10-CM | POA: Diagnosis not present

## 2024-07-27 DIAGNOSIS — Z1231 Encounter for screening mammogram for malignant neoplasm of breast: Secondary | ICD-10-CM | POA: Insufficient documentation

## 2024-07-27 DIAGNOSIS — J45909 Unspecified asthma, uncomplicated: Secondary | ICD-10-CM | POA: Insufficient documentation

## 2024-07-27 DIAGNOSIS — Z23 Encounter for immunization: Secondary | ICD-10-CM | POA: Diagnosis not present

## 2024-07-27 DIAGNOSIS — B20 Human immunodeficiency virus [HIV] disease: Secondary | ICD-10-CM

## 2024-07-27 DIAGNOSIS — R7303 Prediabetes: Secondary | ICD-10-CM | POA: Insufficient documentation

## 2024-07-27 DIAGNOSIS — Z124 Encounter for screening for malignant neoplasm of cervix: Secondary | ICD-10-CM

## 2024-07-27 DIAGNOSIS — Z113 Encounter for screening for infections with a predominantly sexual mode of transmission: Secondary | ICD-10-CM

## 2024-07-27 DIAGNOSIS — E785 Hyperlipidemia, unspecified: Secondary | ICD-10-CM | POA: Diagnosis not present

## 2024-07-27 DIAGNOSIS — E669 Obesity, unspecified: Secondary | ICD-10-CM | POA: Diagnosis not present

## 2024-07-27 DIAGNOSIS — Q738 Other reduction defects of unspecified limb(s): Secondary | ICD-10-CM

## 2024-07-27 MED ORDER — BIKTARVY 50-200-25 MG PO TABS: 1.0000 | ORAL_TABLET | Freq: Every day | ORAL | 11 refills | Status: DC

## 2024-07-27 MED ORDER — ATORVASTATIN CALCIUM 20 MG PO TABS: 20.0000 mg | ORAL_TABLET | Freq: Every day | ORAL | 11 refills | Status: DC

## 2024-07-27 NOTE — Progress Notes (Signed)
 Subjective:  Chief complaint: follow-up for HIV disease off medications   Patient ID: Regina Mcdonald, female    DOB: 27-Jun-1971, 53 y.o.   MRN: [id number]  HPI  Discussed the use of AI scribe software for clinical note transcription with the patient, who gave verbal consent to proceed.  History of Present Illness   Regina Mcdonald is a 53 year old female with HIV who presents with a lapse in medication adherence due to insurance issues.  She has been off her HIV medication, Biktarvy , for approximately two months due to insurance issues. Her insurance indicated she was 'out of network,' preventing her --she thought from obtaining her medication. She has not attempted to fill her prescription during this period and is concerned about the cost of the medication without insurance coverage, noting that it would be almost a thousand dollars.  She is currently on Lipitor for cholesterol management. She has been referred to dietary services and physical therapy for weight management and reports participating in these services.       Past Medical History:  Diagnosis Date   Depression 05/16/2020   Moodiness 07/18/2020   Obesity (BMI 30-39.9) 05/16/2020   Routine screening for STI (sexually transmitted infection) 09/27/2021    Past Surgical History:  Procedure Laterality Date   HERNIA REPAIR      Family History  Problem Relation Age of Onset   Cancer Neg Hx    Hypertension Neg Hx    Diabetes Neg Hx       Social History   Socioeconomic History   Marital status: Widowed    Spouse name: Not on file   Number of children: Not on file   Years of education: Not on file   Highest education level: Not on file  Occupational History   Not on file  Tobacco Use   Smoking status: Never   Smokeless tobacco: Never  Vaping Use   Vaping status: Never Used  Substance and Sexual Activity   Alcohol use: No   Drug use: No   Sexual activity: Not Currently    Comment: declined condoms  Other  Topics Concern   Not on file  Social History Narrative   Not on file   Social Drivers of Health   Financial Resource Strain: High Risk (04/21/2024)   Received from [facility]   Overall Financial Resource Strain (CARDIA)    Difficulty of Paying Living Expenses: Hard  Food Insecurity: Patient Declined (02/06/2024)   Received from [facility]   Hunger Vital Sign    Within the past 12 months, you worried that your food would run out before you got the money to buy more.: Patient declined    Within the past 12 months, the food you bought just didn't last and you didn't have money to get more.: Patient declined  Transportation Needs: No Transportation Needs (02/06/2024)   Received from [facility] - Transportation    Lack of Transportation (Medical): No    Lack of Transportation (Non-Medical): No  Physical Activity: Unknown (04/21/2024)   Received from [facility]   Exercise Vital Sign    On average, how many days per week do you engage in moderate to strenuous exercise (like a brisk walk)?: 0 days    Minutes of Exercise per Session: Not on file  Stress: Stress Concern Present (04/21/2024)   Received from [facility] - Occupational Stress Questionnaire    Feeling of Stress :  To some extent  Social Connections: Moderately Integrated (04/21/2024)   Received from [facility]   Social Network    How would you rate your social network (family, work, friends)?: Adequate participation with social networks    No Known Allergies   Current Outpatient Medications:    albuterol  (VENTOLIN  HFA) 108 (90 Base) MCG/ACT inhaler, Inhale 2 puffs into the lungs every 6 (six) hours as needed for wheezing or shortness of breath., Disp: 8 g, Rfl: 2   atorvastatin  (LIPITOR) 20 MG tablet, Take 1 tablet (20 mg total) by mouth daily., Disp: 30 tablet, Rfl: 11   benzonatate  (TESSALON ) 100 MG capsule, Take 1 capsule (100 mg total) by mouth 3 (three) times  daily as needed. (Patient not taking: Reported on 07/24/2023), Disp: 21 capsule, Rfl: 0   bictegravir-emtricitabine-tenofovir  AF (BIKTARVY ) 50-200-25 MG TABS tablet, Take 1 tablet by mouth daily., Disp: 30 tablet, Rfl: 11   buPROPion  (WELLBUTRIN  XL) 150 MG 24 hr tablet, Take 1 tablet (150 mg total) by mouth daily. (Patient not taking: Reported on 07/24/2023), Disp: 30 tablet, Rfl: 2   diclofenac  (VOLTAREN ) 75 MG EC tablet, Take 1 tablet (75 mg total) by mouth 2 (two) times daily. (Patient not taking: Reported on 07/24/2023), Disp: 50 tablet, Rfl: 2   meloxicam  (MOBIC ) 15 MG tablet, Take 1 tablet (15 mg total) by mouth daily. (Patient not taking: Reported on 07/24/2023), Disp: 30 tablet, Rfl: 1   ondansetron  (ZOFRAN -ODT) 4 MG disintegrating tablet, 4mg  ODT q4 hours prn nausea/vomit (Patient not taking: Reported on 07/24/2023), Disp: 20 tablet, Rfl: 0   phentermine (ADIPEX-P) 37.5 MG tablet, Take by mouth. (Patient not taking: Reported on 10/31/2022), Disp: , Rfl:    Review of Systems  Constitutional:  Negative for activity change, appetite change, chills, diaphoresis, fatigue, fever and unexpected weight change.  HENT:  Negative for congestion, rhinorrhea, sinus pressure, sneezing, sore throat and trouble swallowing.   Eyes:  Negative for photophobia and visual disturbance.  Respiratory:  Negative for cough, chest tightness, shortness of breath, wheezing and stridor.   Cardiovascular:  Negative for chest pain, palpitations and leg swelling.  Gastrointestinal:  Negative for abdominal distention, abdominal pain, anal bleeding, blood in stool, constipation, diarrhea, nausea and vomiting.  Genitourinary:  Negative for difficulty urinating, dysuria, flank pain and hematuria.  Musculoskeletal:  Negative for arthralgias, back pain, gait problem, joint swelling and myalgias.  Skin:  Negative for color change, pallor, rash and wound.  Neurological:  Negative for dizziness, tremors, weakness and light-headedness.   Hematological:  Negative for adenopathy. Does not bruise/bleed easily.  Psychiatric/Behavioral:  Negative for agitation, behavioral problems, confusion, decreased concentration, dysphoric mood and sleep disturbance.        Objective:   Physical Exam Constitutional:      General: She is not in acute distress.    Appearance: Normal appearance. She is well-developed. She is not ill-appearing or diaphoretic.  HENT:     Head: Normocephalic and atraumatic.     Right Ear: Hearing and external ear normal.     Left Ear: Hearing and external ear normal.     Nose: No nasal deformity or rhinorrhea.  Eyes:     General: No scleral icterus.    Conjunctiva/sclera: Conjunctivae normal.     Right eye: Right conjunctiva is not injected.     Left eye: Left conjunctiva is not injected.     Pupils: Pupils are equal, round, and reactive to light.  Neck:     Vascular: No JVD.  Cardiovascular:  Rate and Rhythm: Normal rate and regular rhythm.     Heart sounds: S1 normal and S2 normal.  Pulmonary:     Effort: Pulmonary effort is normal. No respiratory distress.     Breath sounds: No wheezing.  Chest:     Chest wall: No tenderness.  Abdominal:     General: Bowel sounds are normal. There is no distension.     Palpations: Abdomen is soft.     Tenderness: There is no abdominal tenderness.  Musculoskeletal:        General: Normal range of motion.     Right shoulder: Normal.     Left shoulder: Normal.     Cervical back: Normal range of motion and neck supple.     Right hip: Normal.     Left hip: Normal.     Right knee: Normal.     Left knee: Normal.  Lymphadenopathy:     Head:     Right side of head: No submandibular, preauricular or posterior auricular adenopathy.     Left side of head: No submandibular, preauricular or posterior auricular adenopathy.     Cervical: No cervical adenopathy.     Right cervical: No superficial or deep cervical adenopathy.    Left cervical: No superficial or deep  cervical adenopathy.  Skin:    General: Skin is warm and dry.     Coloration: Skin is not pale.     Findings: No abrasion, bruising, ecchymosis, erythema, lesion or rash.     Nails: There is no clubbing.  Neurological:     Mental Status: She is alert and oriented to person, place, and time.     Sensory: No sensory deficit.     Coordination: Coordination normal.     Gait: Gait normal.  Psychiatric:        Attention and Perception: She is attentive.        Mood and Affect: Mood normal.        Speech: Speech normal.        Behavior: Behavior normal. Behavior is cooperative.        Thought Content: Thought content normal.        Judgment: Judgment normal.           Assessment & Plan:   Assessment and Plan    Human immunodeficiency virus (HIV) disease HIV disease with a two-month lapse in antiretroviral therapy due to insurance issues, risking uncontrolled viral replication and potential CD4 count decrease. It turns out that she had not paid the premium on plan so we will pivot to UMAP and RW   --4 bottles of Biktarvy  samples given --HIV RNA, CD4 routine labs and Biktarvy  to [facility] on CW - Educated on the importance of continuous antiretroviral therapy and contacting the clinic for prescription issues.  Obesity with BMI 50.0-59.9 Obesity with BMI over 50.0, contributing to overall health risks. Engaged with dietary and physical therapy interventions at [facility]. - Continue dietary and physical therapy interventions     Dyslipidemia Dyslipidemia managed with Lipitor to reduce cholesterol and fight inflammation. - Continue Lipitor as prescribed.  Vaccine counseling: recommended and pt received flu shot and rx written for [employer] COVID updated vaccine

## 2024-07-27 NOTE — Addendum Note (Signed)
 Addended by: Chelsia Serres M on: 07/27/2024 04:45 PM   Modules accepted: Orders

## 2024-07-27 NOTE — Patient Instructions (Signed)
 Make appt to see Corean Fireman for pap smear and pelvic exam

## 2024-07-28 LAB — T-HELPER CELLS (CD4) COUNT (NOT AT ARMC)
CD4 % Helper T Cell: 51 % (ref 33–65)
CD4 T Cell Abs: 629 /uL (ref 400–1790)

## 2024-07-29 LAB — CBC WITH DIFFERENTIAL/PLATELET
Absolute Lymphocytes: 1398 {cells}/uL (ref 850–3900)
Absolute Monocytes: 371 {cells}/uL (ref 200–950)
Basophils Absolute: 12 {cells}/uL (ref 0–200)
Basophils Relative: 0.2 %
Eosinophils Absolute: 17 {cells}/uL (ref 15–500)
Eosinophils Relative: 0.3 %
HCT: 35.7 % (ref 35.0–45.0)
Hemoglobin: 11.4 g/dL — ABNORMAL LOW (ref 11.7–15.5)
MCH: 28.7 pg (ref 27.0–33.0)
MCHC: 31.9 g/dL — ABNORMAL LOW (ref 32.0–36.0)
MCV: 89.9 fL (ref 80.0–100.0)
MPV: 10.2 fL (ref 7.5–12.5)
Monocytes Relative: 6.4 %
Neutro Abs: 4002 {cells}/uL (ref 1500–7800)
Neutrophils Relative %: 69 %
Platelets: 300 Thousand/uL (ref 140–400)
RBC: 3.97 Million/uL (ref 3.80–5.10)
RDW: 13.1 % (ref 11.0–15.0)
Total Lymphocyte: 24.1 %
WBC: 5.8 Thousand/uL (ref 3.8–10.8)

## 2024-07-29 LAB — COMPLETE METABOLIC PANEL WITHOUT GFR
AG Ratio: 1.5 (calc) (ref 1.0–2.5)
ALT: 14 U/L (ref 6–29)
AST: 20 U/L (ref 10–35)
Albumin: 4.6 g/dL (ref 3.6–5.1)
Alkaline phosphatase (APISO): 48 U/L (ref 37–153)
BUN: 14 mg/dL (ref 7–25)
CO2: 29 mmol/L (ref 20–32)
Calcium: 9.4 mg/dL (ref 8.6–10.4)
Chloride: 106 mmol/L (ref 98–110)
Creat: 0.63 mg/dL (ref 0.50–1.03)
Globulin: 3.1 g/dL (ref 1.9–3.7)
Glucose, Bld: 85 mg/dL (ref 65–99)
Potassium: 3.5 mmol/L (ref 3.5–5.3)
Sodium: 143 mmol/L (ref 135–146)
Total Bilirubin: 0.5 mg/dL (ref 0.2–1.2)
Total Protein: 7.7 g/dL (ref 6.1–8.1)

## 2024-07-29 LAB — LIPID PANEL
Cholesterol: 146 mg/dL (ref <200)
HDL: 54 mg/dL (ref >=50)
LDL Cholesterol (Calc): 72 mg/dL
Non-HDL Cholesterol (Calc): 92 mg/dL (ref <130)
Total CHOL/HDL Ratio: 2.7 (calc) (ref <5.0)
Triglycerides: 118 mg/dL (ref <150)

## 2024-07-29 LAB — HIV-1 RNA QUANT-NO REFLEX-BLD
HIV 1 RNA Quant: NOT DETECTED {copies}/mL
HIV-1 RNA Quant, Log: NOT DETECTED {Log_copies}/mL

## 2024-07-29 LAB — HEMOGLOBIN A1C
Hgb A1c MFr Bld: 5.7 % — ABNORMAL HIGH (ref <5.7)
Mean Plasma Glucose: 117 mg/dL
eAG (mmol/L): 6.5 mmol/L

## 2024-07-29 LAB — RPR: RPR Ser Ql: NONREACTIVE

## 2024-07-29 LAB — URINE CYTOLOGY ANCILLARY ONLY
Chlamydia: NEGATIVE
Comment: NEGATIVE
Comment: NORMAL
Neisseria Gonorrhea: NEGATIVE

## 2024-07-31 ENCOUNTER — Other Ambulatory Visit: Payer: Self-pay | Admitting: Pharmacist

## 2024-07-31 MED ORDER — BICTEGRAVIR-EMTRICITAB-TENOFOV 50-200-25 MG PO TABS: 1.0000 | ORAL_TABLET | Freq: Every day | ORAL | Status: AC

## 2024-07-31 NOTE — Progress Notes (Signed)
 Medication Samples have been provided to the patient.  Drug name: Biktarvy        Strength: 50/200/25 mg       Qty: 28 tablets (4 bottles) LOT: CTGMDA   Exp.Date: 7/27  Samples requested by Dr. Ernie Heal.  Dosing instructions: Take one tablet by mouth once daily  The patient has been instructed regarding the correct time, dose, and frequency of taking this medication, including desired effects and most common side effects.   Nicklas Barns, PharmD, CPP, BCIDP, AAHIVP Clinical Pharmacist Practitioner Infectious Diseases Clinical Pharmacist [facility]

## 2024-08-26 ENCOUNTER — Telehealth (HOSPITAL_BASED_OUTPATIENT_CLINIC_OR_DEPARTMENT_OTHER): Payer: Self-pay

## 2024-08-30 ENCOUNTER — Encounter: Payer: Self-pay | Admitting: Infectious Disease

## 2024-08-30 DIAGNOSIS — E785 Hyperlipidemia, unspecified: Secondary | ICD-10-CM | POA: Insufficient documentation

## 2024-08-30 NOTE — Progress Notes (Unsigned)
 Subjective:  Chief complaint: follow-up for HIV disease on medications   Patient ID: Regina Mcdonald, female    DOB: 06-12-1971, 53 y.o.   MRN: [id number]  HPI  Past Medical History:  Diagnosis Date   Depression 05/16/2020   Hyperlipidemia 08/30/2024   Moodiness 07/18/2020   Obesity (BMI 30-39.9) 05/16/2020   Routine screening for STI (sexually transmitted infection) 09/27/2021    Past Surgical History:  Procedure Laterality Date   HERNIA REPAIR      Family History  Problem Relation Age of Onset   Cancer Neg Hx    Hypertension Neg Hx    Diabetes Neg Hx       Social History   Socioeconomic History   Marital status: Widowed    Spouse name: Not on file   Number of children: Not on file   Years of education: Not on file   Highest education level: Not on file  Occupational History   Not on file  Tobacco Use   Smoking status: Never   Smokeless tobacco: Never  Vaping Use   Vaping status: Never Used  Substance and Sexual Activity   Alcohol use: No   Drug use: No   Sexual activity: Not Currently    Comment: declined condoms  Other Topics Concern   Not on file  Social History Narrative   Not on file   Social Drivers of Health   Financial Resource Strain: High Risk (04/21/2024)   Received from [facility]   Overall Financial Resource Strain (CARDIA)    Difficulty of Paying Living Expenses: Hard  Food Insecurity: Patient Declined (02/06/2024)   Received from [facility]   Hunger Vital Sign    Within the past 12 months, you worried that your food would run out before you got the money to buy more.: Patient declined    Within the past 12 months, the food you bought just didn't last and you didn't have money to get more.: Patient declined  Transportation Needs: No Transportation Needs (02/06/2024)   Received from [facility] - Transportation    Lack of Transportation (Medical): No    Lack of Transportation (Non-Medical): No  Physical Activity:  Unknown (04/21/2024)   Received from [facility]   Exercise Vital Sign    On average, how many days per week do you engage in moderate to strenuous exercise (like a brisk walk)?: 0 days    Minutes of Exercise per Session: Not on file  Stress: Stress Concern Present (04/21/2024)   Received from [facility] - Occupational Stress Questionnaire    Feeling of Stress : To some extent  Social Connections: Moderately Integrated (04/21/2024)   Received from [facility]   Social Network    How would you rate your social network (family, work, friends)?: Adequate participation with social networks    No Known Allergies   Current Outpatient Medications:    albuterol  (VENTOLIN  HFA) 108 (90 Base) MCG/ACT inhaler, Inhale 2 puffs into the lungs every 6 (six) hours as needed for wheezing or shortness of breath., Disp: 8 g, Rfl: 2   atorvastatin  (LIPITOR) 20 MG tablet, Take 1 tablet (20 mg total) by mouth daily., Disp: 30 tablet, Rfl: 11   benzonatate  (TESSALON ) 100 MG capsule, Take 1 capsule (100 mg total) by mouth 3 (three) times daily as needed. (Patient not taking: Reported on 07/24/2023), Disp: 21 capsule, Rfl: 0   bictegravir-emtricitabine-tenofovir  AF (BIKTARVY ) 50-200-25 MG TABS tablet, Take  1 tablet by mouth daily., Disp: 30 tablet, Rfl: 11   buPROPion  (WELLBUTRIN  XL) 150 MG 24 hr tablet, Take 1 tablet (150 mg total) by mouth daily. (Patient not taking: Reported on 07/24/2023), Disp: 30 tablet, Rfl: 2   diclofenac  (VOLTAREN ) 75 MG EC tablet, Take 1 tablet (75 mg total) by mouth 2 (two) times daily. (Patient not taking: Reported on 07/24/2023), Disp: 50 tablet, Rfl: 2   meloxicam  (MOBIC ) 15 MG tablet, Take 1 tablet (15 mg total) by mouth daily. (Patient not taking: Reported on 07/24/2023), Disp: 30 tablet, Rfl: 1   ondansetron  (ZOFRAN -ODT) 4 MG disintegrating tablet, 4mg  ODT q4 hours prn nausea/vomit (Patient not taking: Reported on 07/24/2023), Disp: 20 tablet, Rfl:  0   phentermine (ADIPEX-P) 37.5 MG tablet, Take by mouth. (Patient not taking: Reported on 10/31/2022), Disp: , Rfl:    Review of Systems     Objective:   Physical Exam        Assessment & Plan:

## 2024-08-31 ENCOUNTER — Ambulatory Visit: Payer: Self-pay | Admitting: Infectious Disease

## 2024-08-31 ENCOUNTER — Encounter: Payer: Self-pay | Admitting: Infectious Disease

## 2024-08-31 ENCOUNTER — Other Ambulatory Visit: Payer: Self-pay

## 2024-08-31 ENCOUNTER — Other Ambulatory Visit (HOSPITAL_COMMUNITY): Payer: Self-pay

## 2024-08-31 VITALS — BP 136/84 | HR 74 | Temp 99.2°F | Wt 288.6 lb

## 2024-08-31 DIAGNOSIS — B20 Human immunodeficiency virus [HIV] disease: Secondary | ICD-10-CM

## 2024-08-31 DIAGNOSIS — Z23 Encounter for immunization: Secondary | ICD-10-CM

## 2024-08-31 DIAGNOSIS — Z1231 Encounter for screening mammogram for malignant neoplasm of breast: Secondary | ICD-10-CM

## 2024-08-31 DIAGNOSIS — Z7185 Encounter for immunization safety counseling: Secondary | ICD-10-CM

## 2024-08-31 DIAGNOSIS — Z6841 Body Mass Index (BMI) 40.0 and over, adult: Secondary | ICD-10-CM

## 2024-08-31 DIAGNOSIS — Z79899 Other long term (current) drug therapy: Secondary | ICD-10-CM

## 2024-08-31 DIAGNOSIS — E785 Hyperlipidemia, unspecified: Secondary | ICD-10-CM

## 2024-08-31 DIAGNOSIS — J45909 Unspecified asthma, uncomplicated: Secondary | ICD-10-CM

## 2024-08-31 MED ORDER — BIKTARVY 50-200-25 MG PO TABS
1.0000 | ORAL_TABLET | Freq: Every day | ORAL | 11 refills | Status: AC
Start: 2024-08-31 — End: ?

## 2024-08-31 MED ORDER — ATORVASTATIN CALCIUM 20 MG PO TABS: 20.0000 mg | ORAL_TABLET | Freq: Every day | ORAL | 11 refills | Status: AC

## 2024-09-08 ENCOUNTER — Other Ambulatory Visit: Payer: Self-pay | Admitting: Infectious Disease

## 2024-09-08 DIAGNOSIS — Z1231 Encounter for screening mammogram for malignant neoplasm of breast: Secondary | ICD-10-CM

## 2024-09-10 ENCOUNTER — Telehealth (HOSPITAL_BASED_OUTPATIENT_CLINIC_OR_DEPARTMENT_OTHER): Payer: Self-pay

## 2024-09-10 ENCOUNTER — Telehealth: Payer: Self-pay

## 2024-09-10 ENCOUNTER — Other Ambulatory Visit (HOSPITAL_COMMUNITY): Payer: Self-pay

## 2024-09-10 NOTE — Telephone Encounter (Signed)
 [facility] Patient Advocate Encounter   Patient has been approved for [employer] Advancing Access Patient Assistance Program for Biktarvy   from 09/10/24 to 09/10/24.  This assistance will make the patient's copay 0.00.  Medication will be shipped to the patient home from [facility].         Arland Hutchinson, CPhT Specialty Pharmacy Patient [facility] [place] for Infectious Disease Phone: [phone] Fax: [phone] 09/10/2024 4:28 PM

## 2024-09-10 NOTE — Telephone Encounter (Signed)
 Tried to call patient to advise that they will be calling to set up delivery for medication through [employer] since over income for ryan white.

## 2024-09-24 ENCOUNTER — Telehealth: Payer: Self-pay

## 2024-09-24 NOTE — Telephone Encounter (Signed)
 [facility] Patient Advocate Encounter  [facility] specialty pharmacy and I have been unsuccessful in reaching patient to be able to refill medication.    Patient will need to call [facility] [phone] to get his refill filled.  We have tried multiple times without a response.  Arland Hutchinson, CPhT Specialty Pharmacy Patient [facility] for Infectious Disease Phone: [phone] Fax:  [phone]

## 2024-09-29 ENCOUNTER — Encounter: Payer: Self-pay | Admitting: Pharmacist

## 2024-09-29 NOTE — Telephone Encounter (Signed)
 Same situation - messaged patient that she will need to call for refills as [facility] pharmacy and our pharmacy team have not been able to reach patient.

## 2024-10-22 ENCOUNTER — Other Ambulatory Visit: Payer: Self-pay | Admitting: Medical Genetics

## 2024-10-27 ENCOUNTER — Other Ambulatory Visit: Payer: Self-pay | Admitting: Medical Genetics

## 2024-10-27 DIAGNOSIS — Z006 Encounter for examination for normal comparison and control in clinical research program: Secondary | ICD-10-CM

## 2024-11-06 LAB — GENECONNECT MOLECULAR SCREEN: Genetic Analysis Overall Interpretation: NEGATIVE

## 2024-12-28 ENCOUNTER — Other Ambulatory Visit (HOSPITAL_COMMUNITY): Payer: Self-pay

## 2025-03-01 ENCOUNTER — Ambulatory Visit: Payer: Self-pay | Admitting: Infectious Disease
